# Patient Record
Sex: Female | Born: 1990 | Race: Black or African American | Hispanic: No | Marital: Married | Smoking: Former smoker
Health system: Southern US, Community
[De-identification: ages and names within clinical notes are randomized; demographics above are authoritative.]

## PROBLEM LIST (undated history)

## (undated) DIAGNOSIS — IMO0002 Reserved for concepts with insufficient information to code with codable children: Secondary | ICD-10-CM

## (undated) DIAGNOSIS — F424 Excoriation (skin-picking) disorder: Secondary | ICD-10-CM

## (undated) DIAGNOSIS — B009 Herpesviral infection, unspecified: Secondary | ICD-10-CM

## (undated) DIAGNOSIS — L981 Factitial dermatitis: Secondary | ICD-10-CM

## (undated) DIAGNOSIS — D649 Anemia, unspecified: Secondary | ICD-10-CM

## (undated) DIAGNOSIS — F329 Major depressive disorder, single episode, unspecified: Secondary | ICD-10-CM

## (undated) DIAGNOSIS — F32A Depression, unspecified: Secondary | ICD-10-CM

## (undated) DIAGNOSIS — B977 Papillomavirus as the cause of diseases classified elsewhere: Secondary | ICD-10-CM

## (undated) HISTORY — PX: NO PAST SURGERIES: SHX2092

---
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
## 2008-10-17 ENCOUNTER — Emergency Department: Payer: Self-pay | Admitting: Emergency Medicine

## 2008-11-10 ENCOUNTER — Emergency Department: Payer: Self-pay | Admitting: Emergency Medicine

## 2008-11-10 IMAGING — US US OB < 14 WEEKS
1 series · 17 of 28 positions shown · non-contrast
Comparison: none

REASON FOR EXAM: syncope, pregnant, abdominal pain
COMMENTS:

[Series 1: us ob < 14 weeks · 17 of 43 slices shown]
[im 1/43]
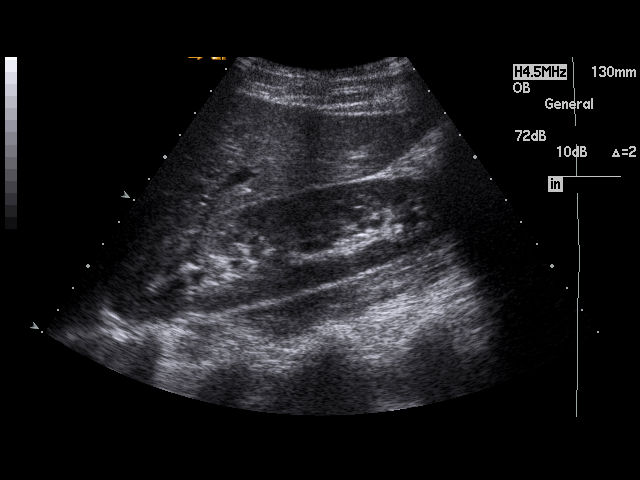
[im 4/43]
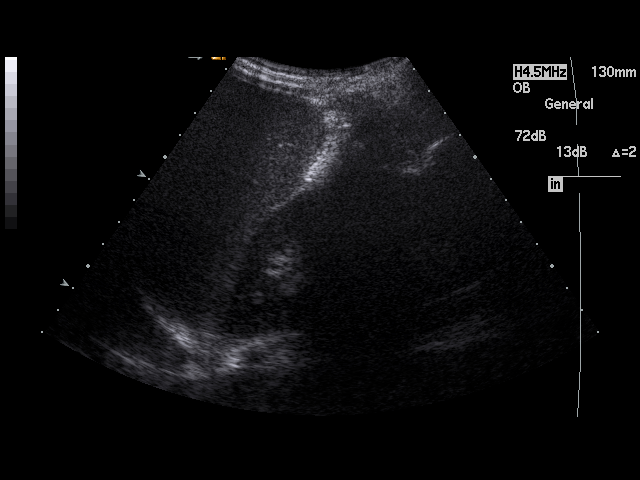
[im 7/43]
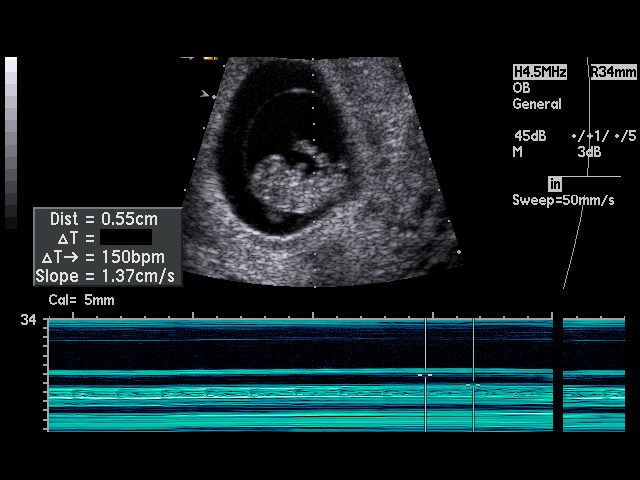
[im 8/43]
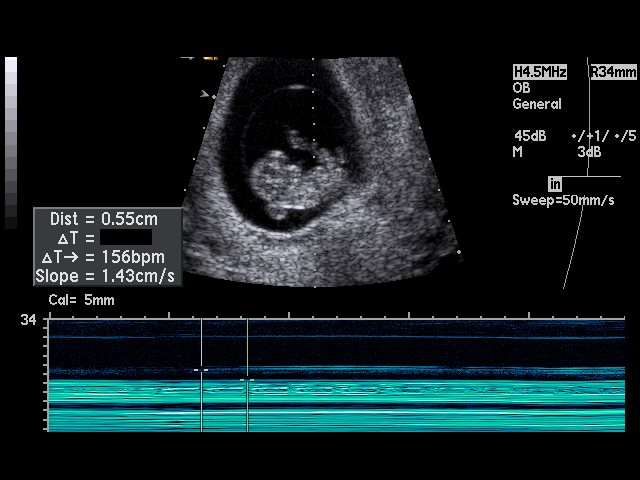
[im 11/43]
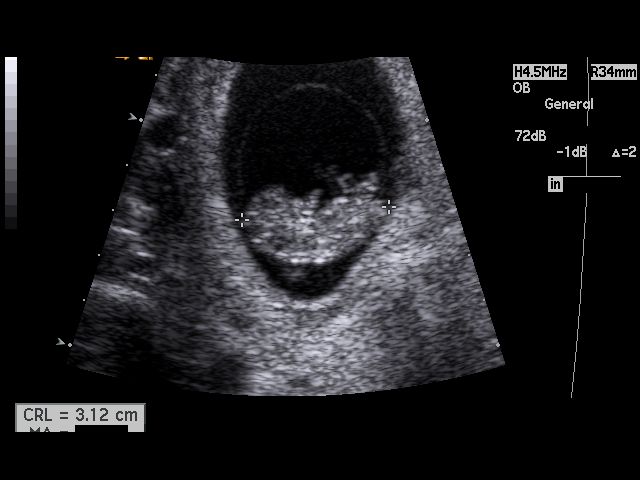
[im 15/43]
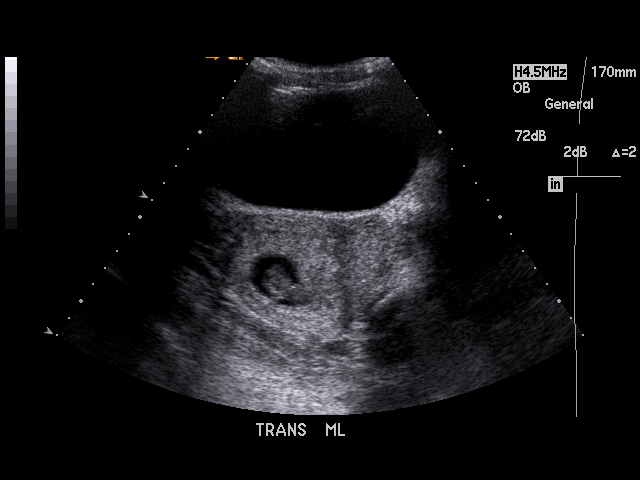
[im 16/43]
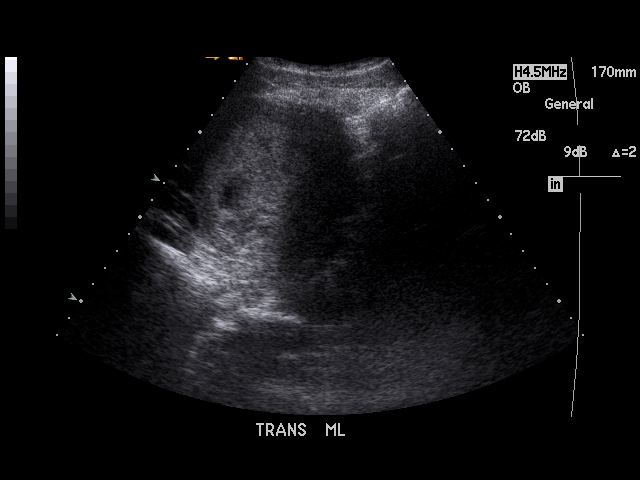
[im 19/43]
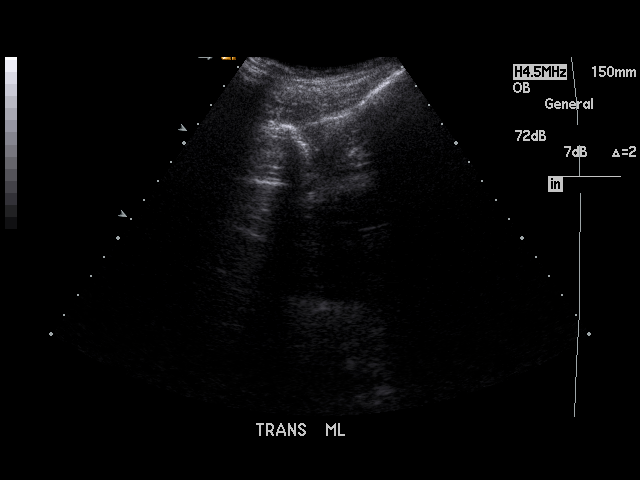
[im 22/43]
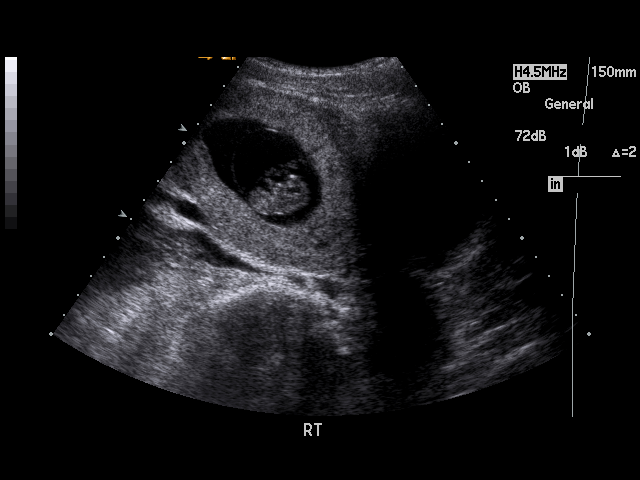
[im 24/43]
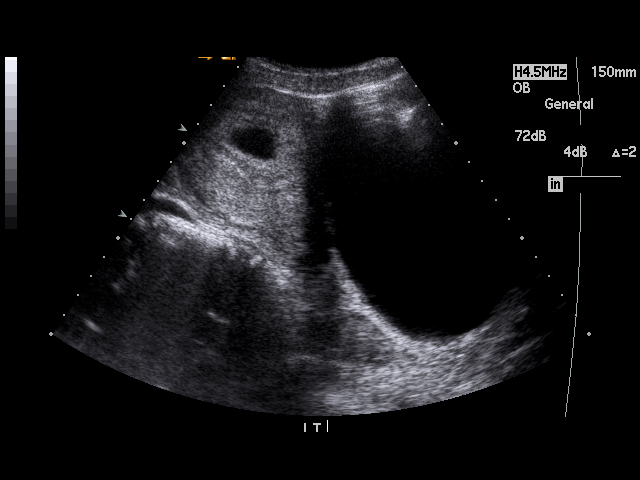
[im 27/43]
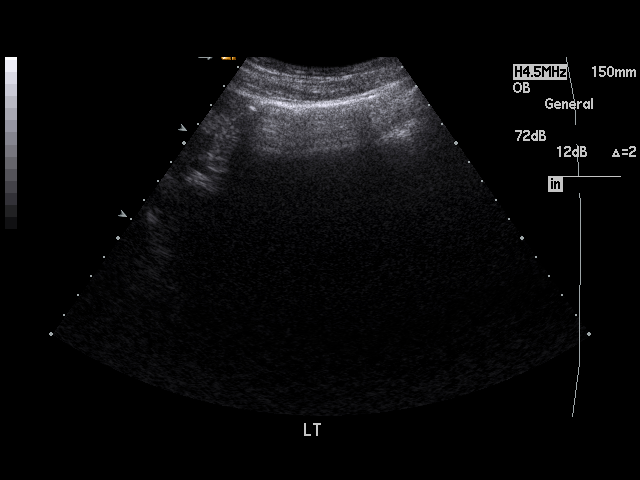
[im 29/43]
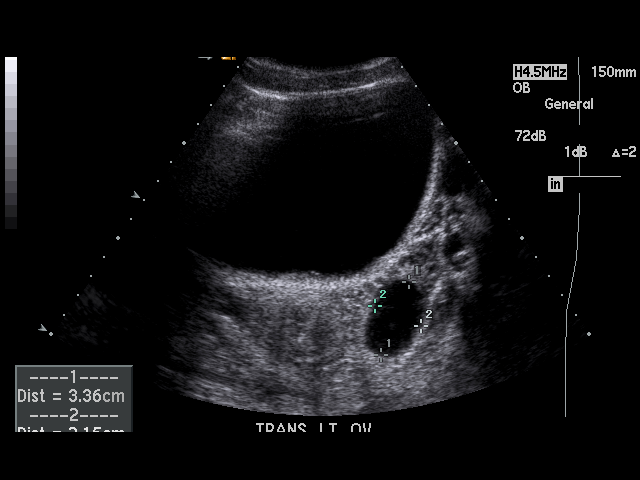
[im 32/43]
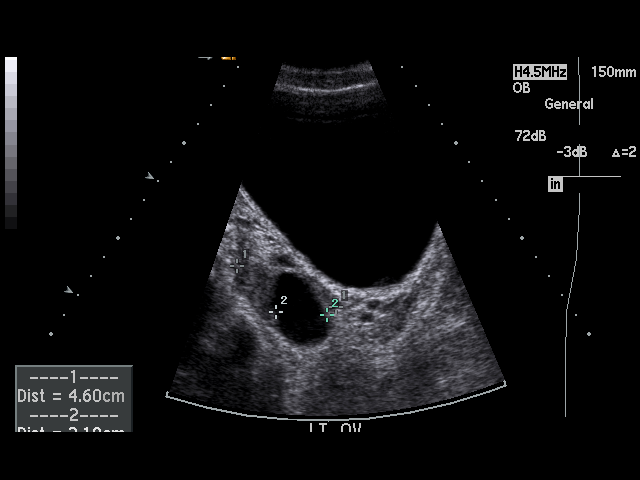
[im 35/43]
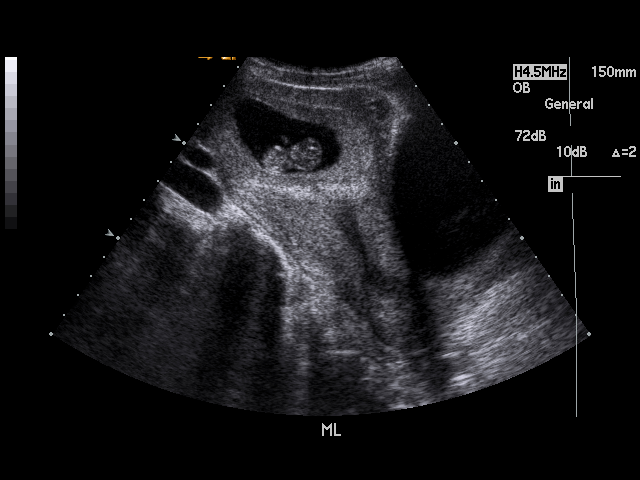
[im 36/43]
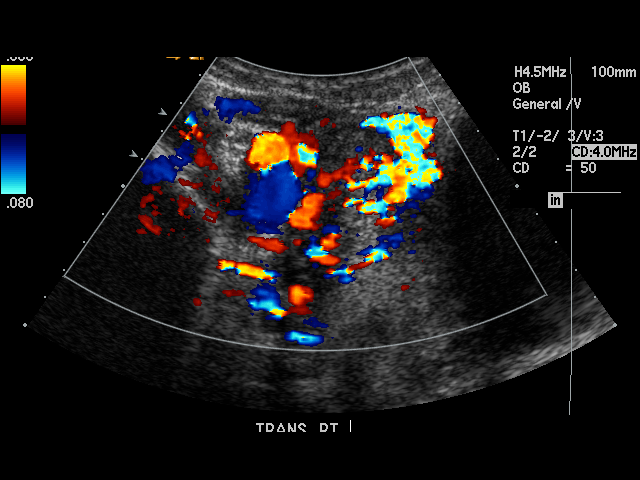
[im 39/43]
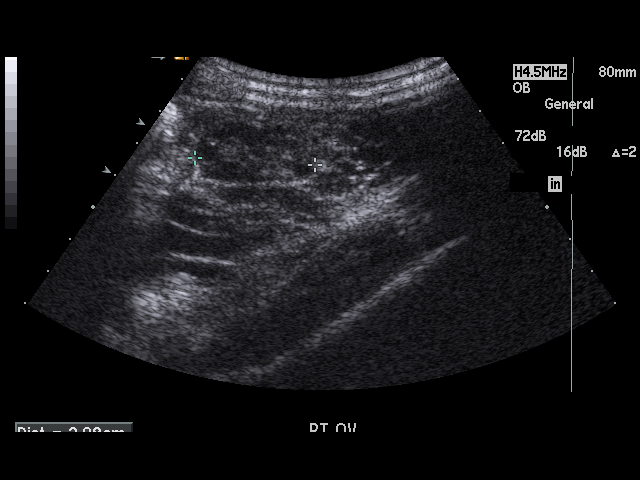
[im 43/43]
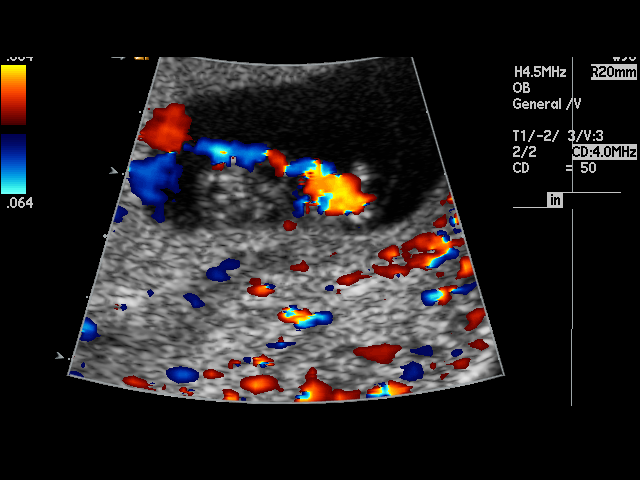

[17 of 28 positions shown; findings below may reference images not displayed]

PROCEDURE:     US  - US OB LESS THAN 14 WEEKS  - [DATE]  [DATE]

RESULT:     A single viable intrauterine pregnancy is appreciated with an
estimated gestational age of 10 [PO] days.  Estimated fetal heart rate is
150 beats per minute.  Evaluation of the RIGHT ovary demonstrates arterial
and venous flow.  A LEFT ovarian cyst is identified measuring 3.3 cm in
diameter. There is no evidence of pelvic free fluid nor drainable loculated
fluid collections.  The uterus demonstrates a homogenous echotexture. Crown
rump length is 3.13 cm which corresponds to an estimated gestational age of
10 [PO] days.
IMPRESSION: 1. Single, viable intrauterine pregnancy as described above.

Dr. ES of the Emergency Department was informed of these findings via
a preliminary faxed report on [DATE] at [DATE] p.m. Eastern Standard Time.

## 2008-12-27 ENCOUNTER — Emergency Department: Payer: Self-pay | Admitting: Emergency Medicine

## 2008-12-27 IMAGING — US US OB US >=[ID] SNGL FETUS
1 series · 17 of 28 positions shown · non-contrast
Comparison: none

REASON FOR EXAM: abd cramping, pregnant
COMMENTS:

[Series 1: us ob us >=(id) sngl fetus · 17 of 71 slices shown]
[im 1/71]
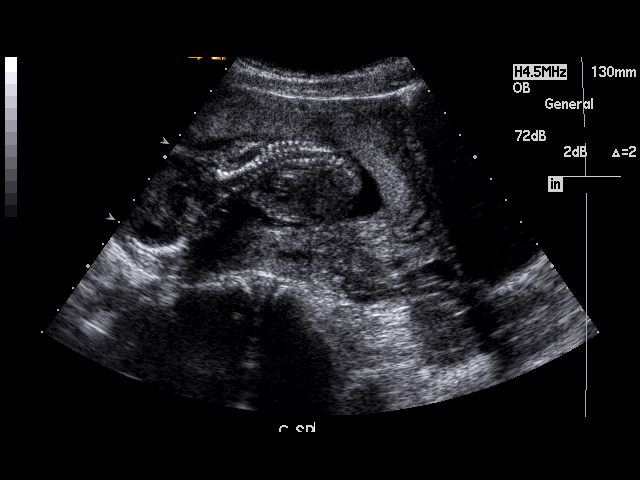
[im 6/71]
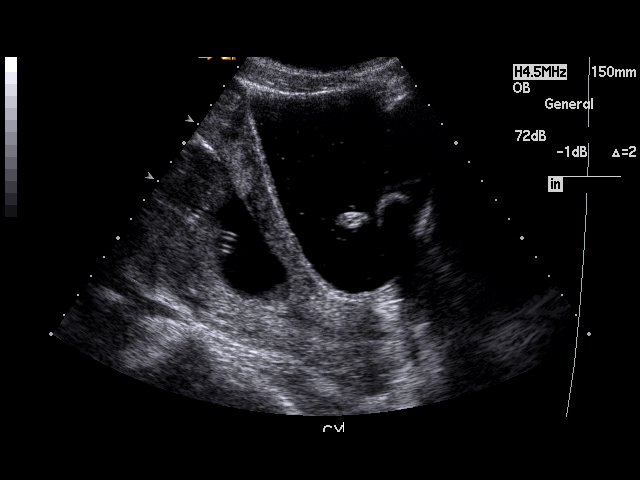
[im 11/71]
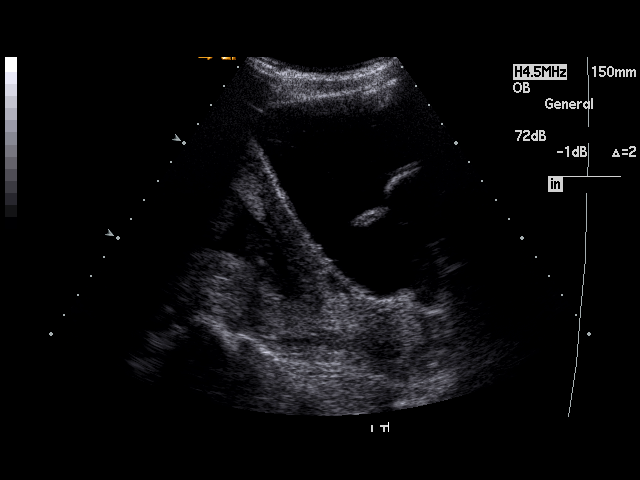
[im 13/71]
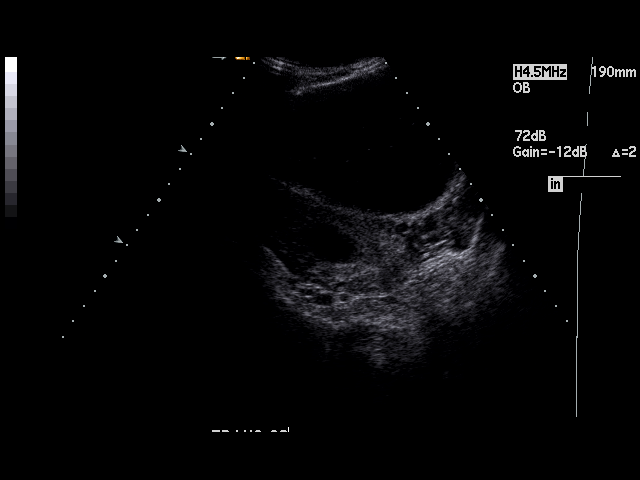
[im 19/71]
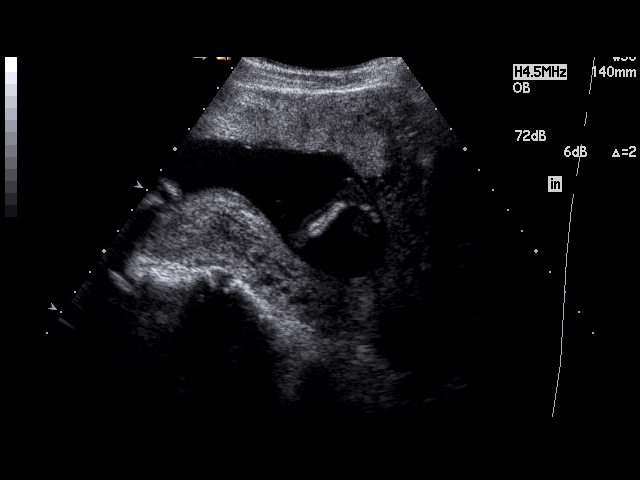
[im 24/71]
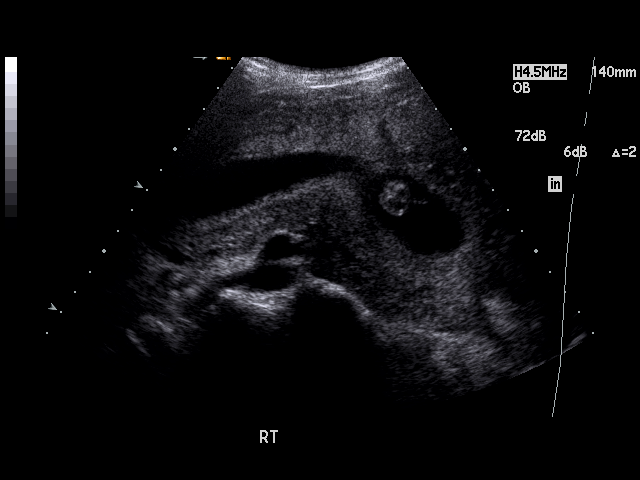
[im 26/71]
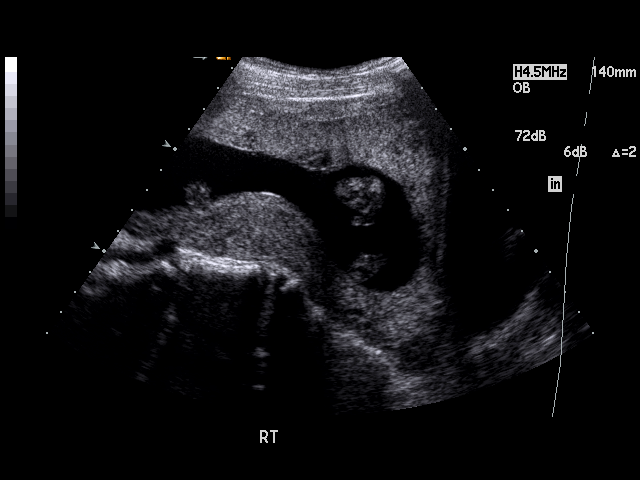
[im 32/71]
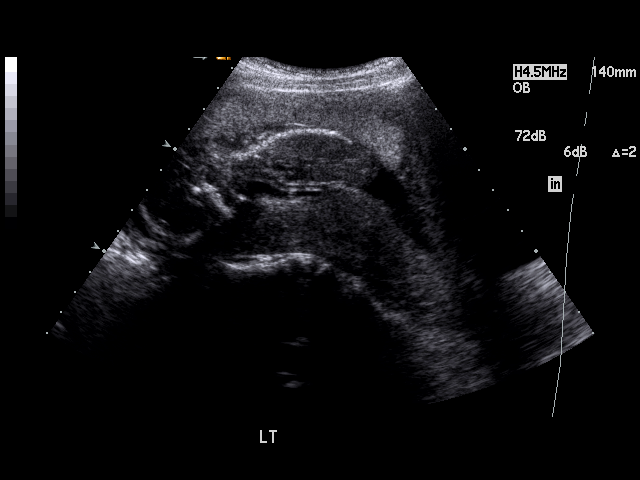
[im 37/71]
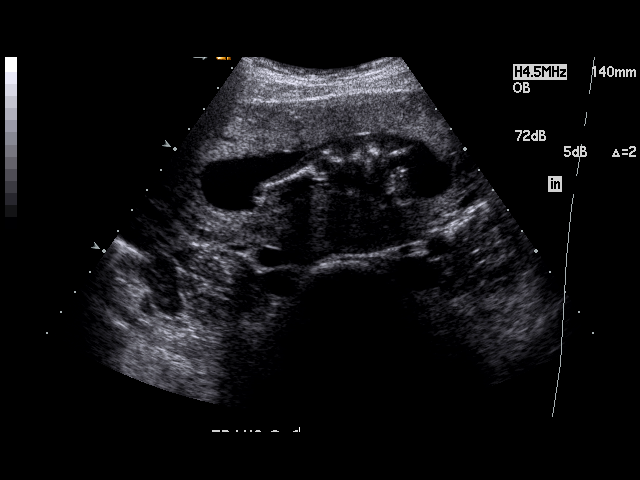
[im 39/71]
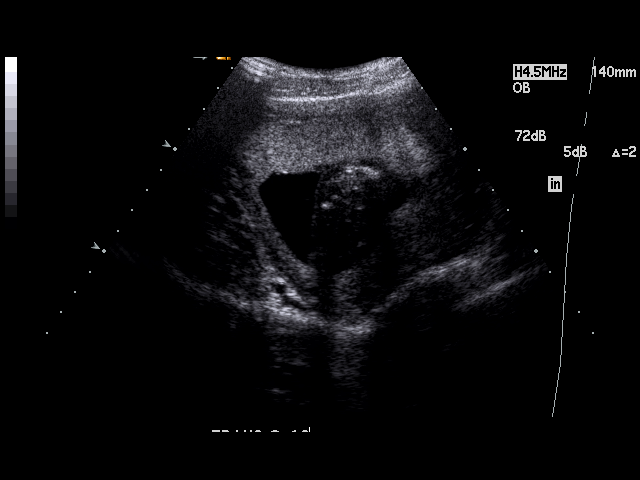
[im 45/71]
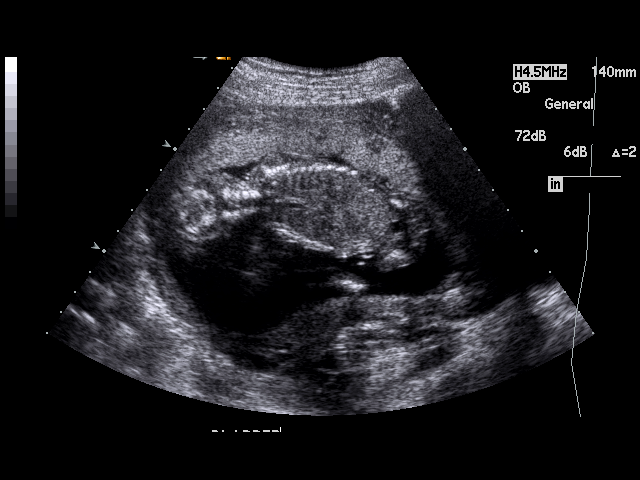
[im 47/71]
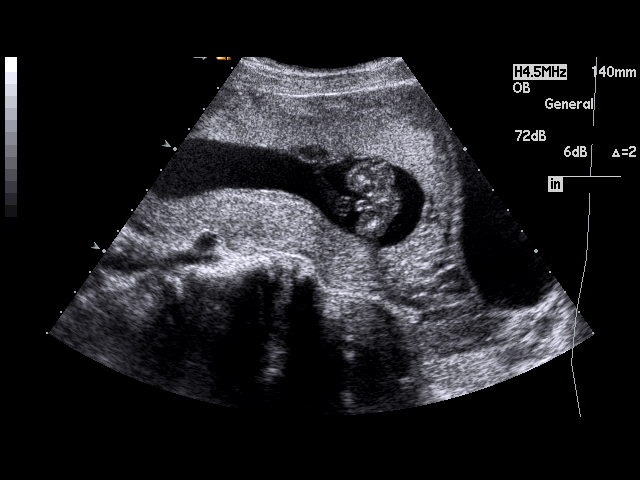
[im 52/71]
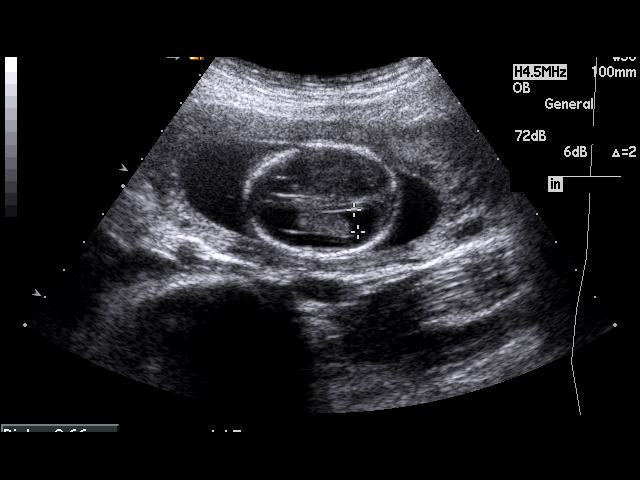
[im 58/71]
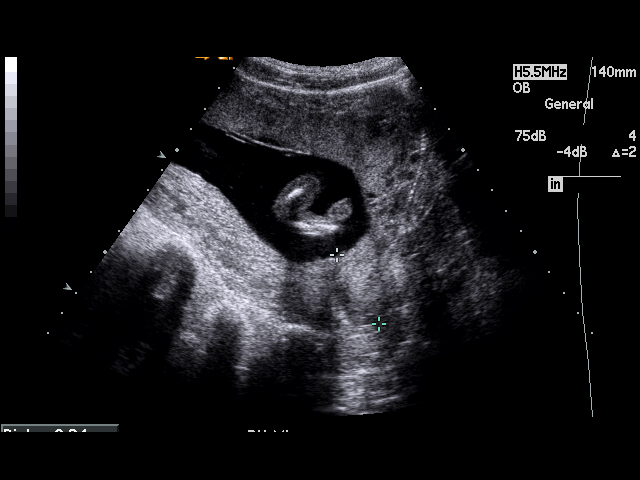
[im 60/71]
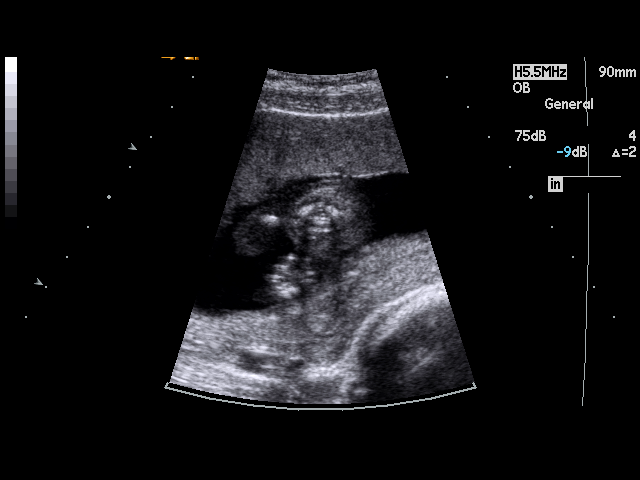
[im 65/71]
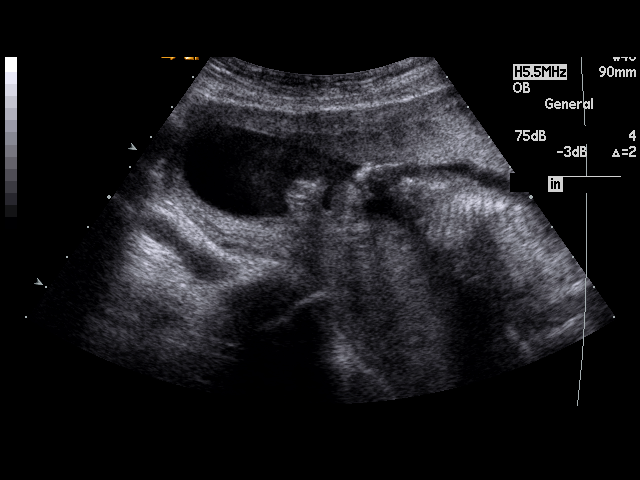
[im 71/71]
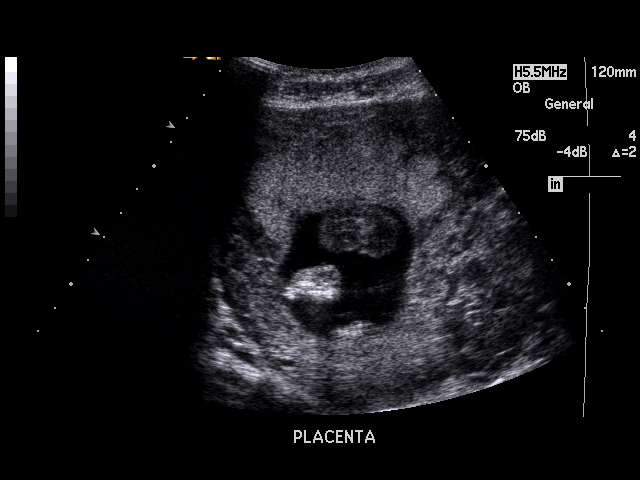

[17 of 28 positions shown; findings below may reference images not displayed]

PROCEDURE:     US  - US OB GREATER/OR EQUAL TO [D7]  - [DATE]  [DATE]

RESULT:     Emergent early OB protocol ultrasound examination is performed.
The fetus is too young to evaluate fetal anatomy and a followup scheduled
appointment would be recommended. There is a single intrauterine fetus with
cardiac, trunk and extremity motion visible during the exam. The amniotic
fluid volume is visually normal. The placenta is anterior in position. There
is no evidence of a marginal placenta or placenta previa. There is a breech
presentation. There has been appropriate growth since the previous early OB
ultrasound on [DATE]. The current gestational age is estimated at 16
weeks 4 days with an estimated delivery date of [DATE] and a current
estimated fetal weight of 181 grams + / - 24 grams.
IMPRESSION: No evidence of complication. The fetal anatomy cannot be
evaluated at this point given the early age. No gross abnormality is
evident. The fetal heart rate is 144 beats per minute. The

## 2009-02-18 ENCOUNTER — Ambulatory Visit: Payer: Self-pay | Admitting: Certified Nurse Midwife

## 2009-02-18 IMAGING — US US OB US >=[ID] SNGL FETUS
1 series · 17 of 28 positions shown · non-contrast
Comparison: none

REASON FOR EXAM: Anatomy Dates Placenta Loc
COMMENTS:

[Series 1: us ob us >=(id) sngl fetus · 17 of 45 slices shown]
[im 1/45]
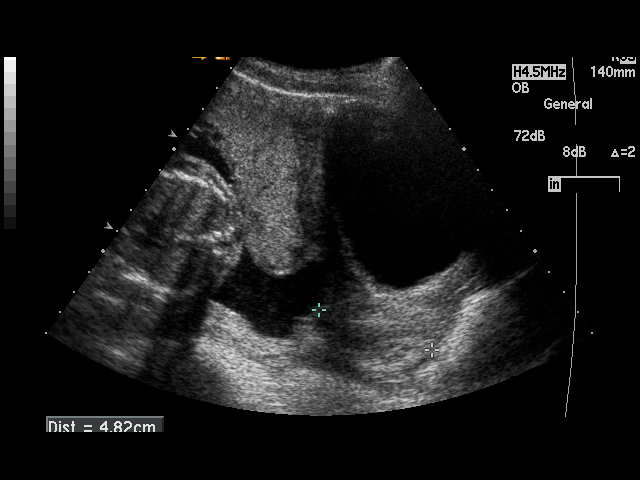
[im 4/45]
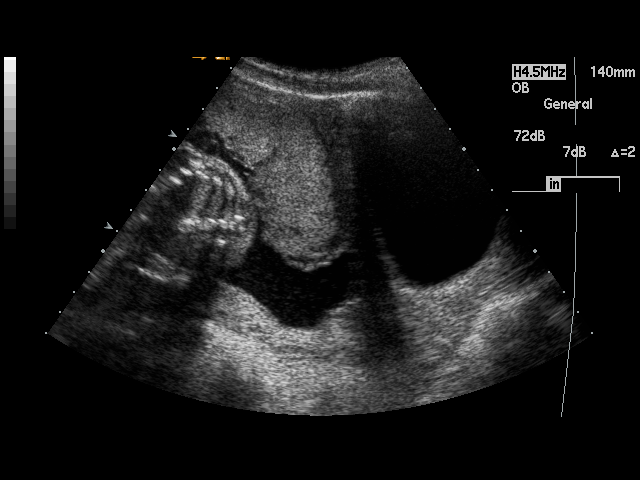
[im 7/45]
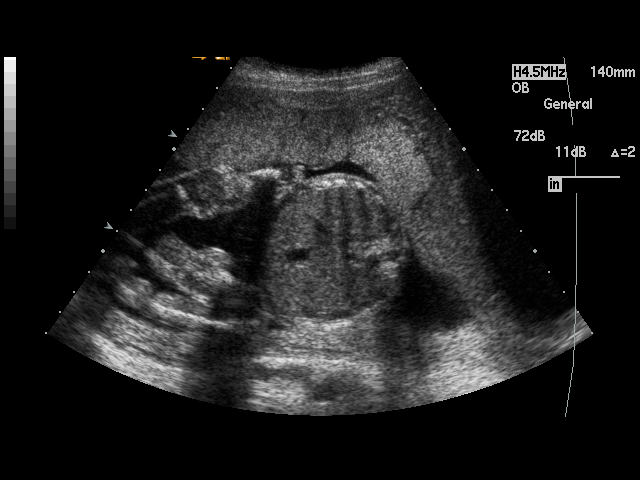
[im 9/45]
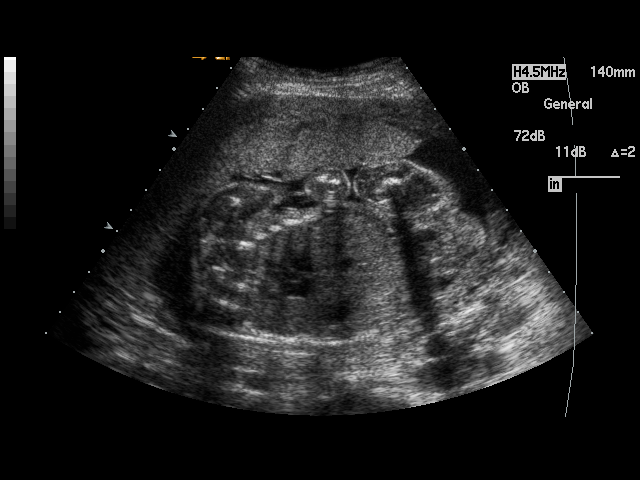
[im 12/45]
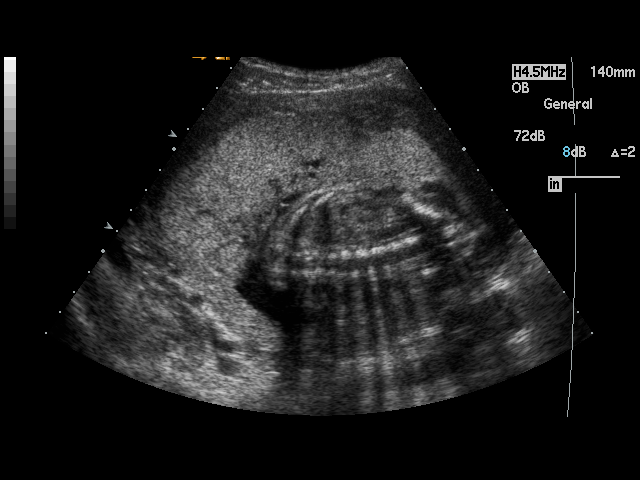
[im 15/45]
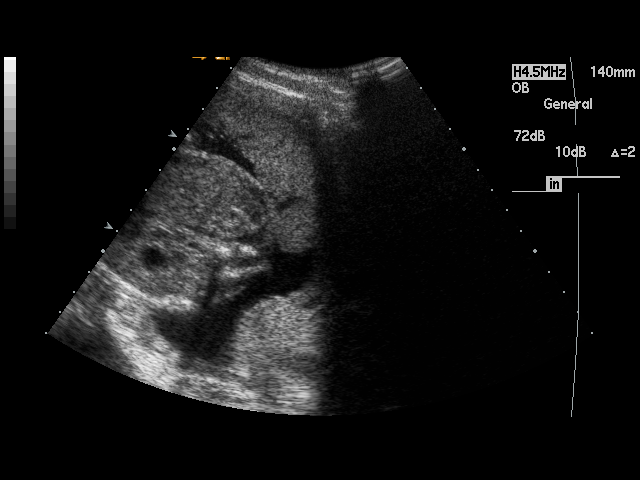
[im 17/45]
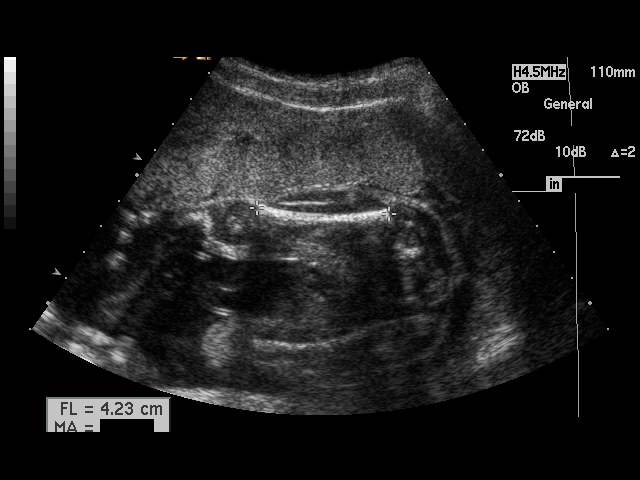
[im 20/45]
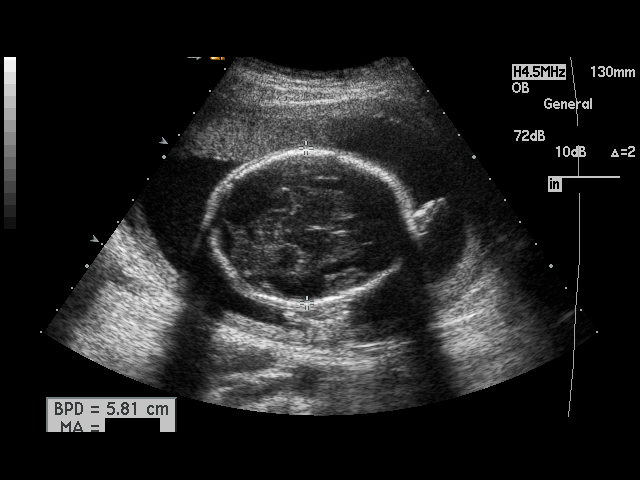
[im 23/45]
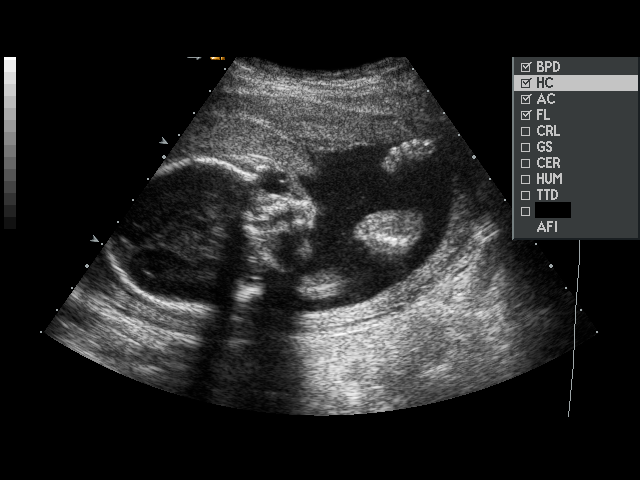
[im 25/45]
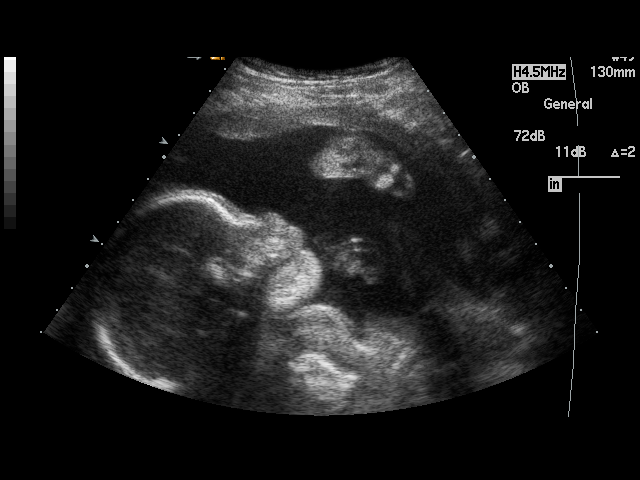
[im 28/45]
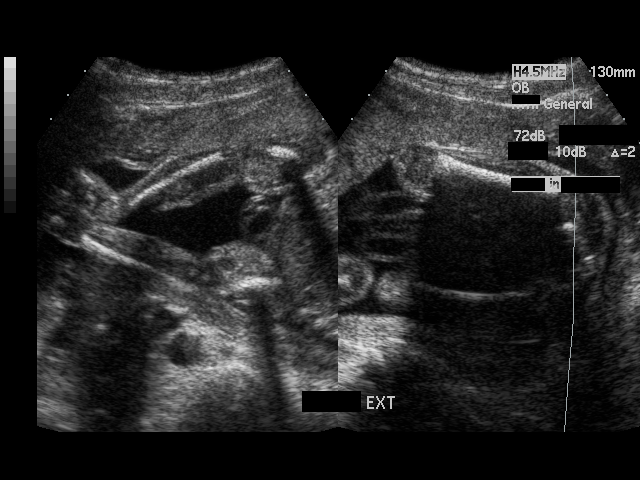
[im 30/45]
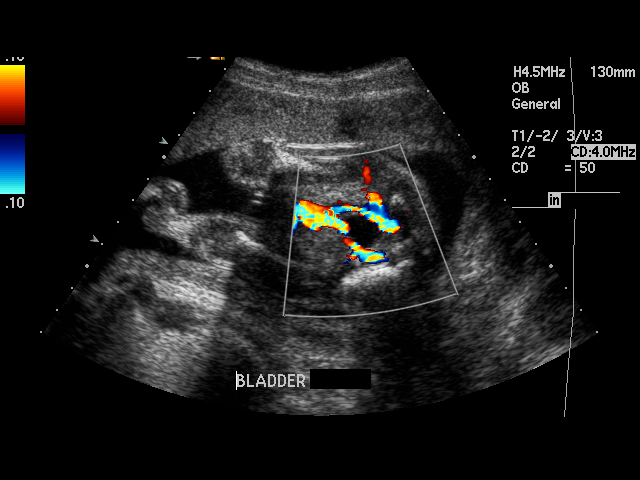
[im 33/45]
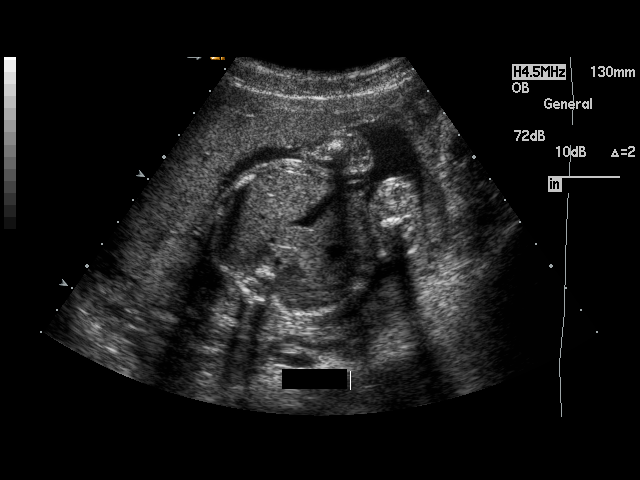
[im 36/45]
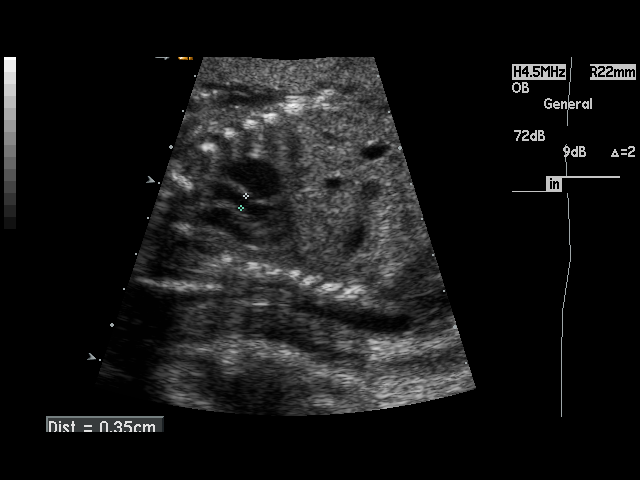
[im 38/45]
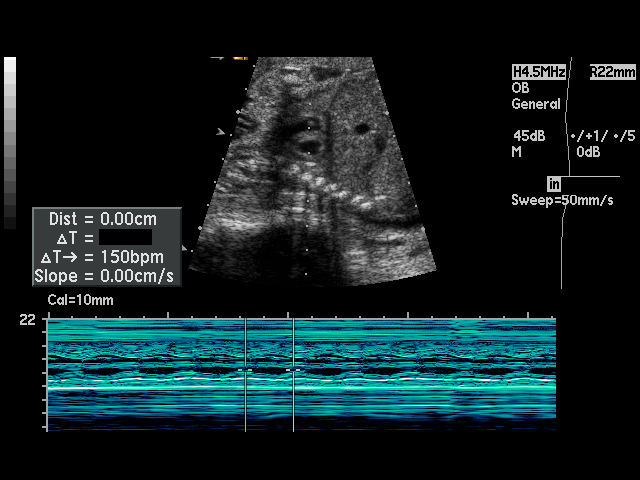
[im 41/45]
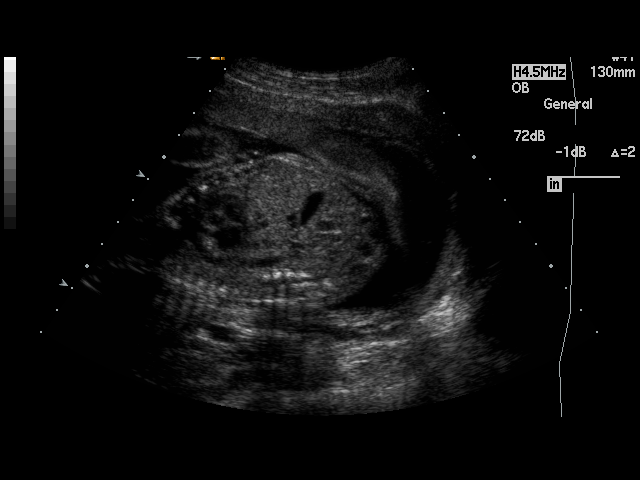
[im 45/45]
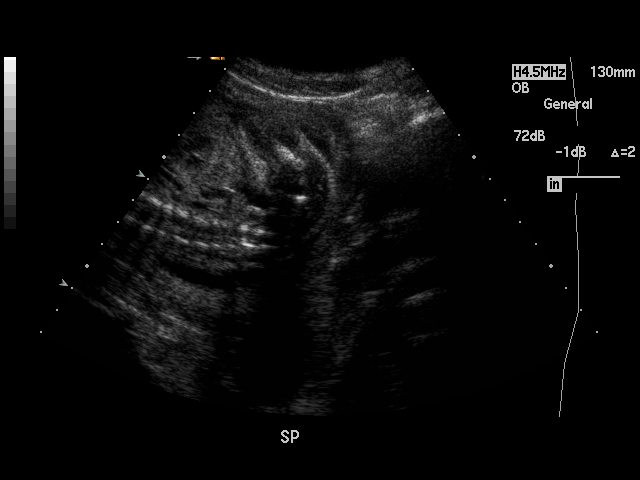

[17 of 28 positions shown; findings below may reference images not displayed]

PROCEDURE:     US  - US OB GREATER/OR EQUAL TO [Y8]  - [DATE]  [DATE]

RESULT:     There is a viable IUP with an anterior low lying placenta. The
presentation of the fetus is variable. The amniotic fluid volume is
estimated to be normal. Fetal cardiac activity with a rate of 150 beats
permit was demonstrated. The fetal stomach, kidneys, and urinary bladder
were demonstrated and appeared normal. The intracranial structures, the
craniocervical junction, and the spinal structures are normal in appearance.
A 4 chambered heart was demonstrated.

Measured parameters:

BPD 5.78 cm corresponding to an EGA of 23 weeks 5 days
HC 22.12 cm corresponding to an EGA of 24 weeks one day
A.C. 19.52 cm corresponding to an EGA of 24 weeks 2 days
FL 4.2 cm corresponding to an EGA of 5 correction of 23 weeks 5 days. The
estimated fetal weight is 669 grams + / - 90 grams.
IMPRESSION: 1. There is a viable IUP with variable presentation. The placenta is
anterior and low lying. The estimated gestational age is 24 weeks 0 days + / -
12 days. The estimated date of confinement is [DATE]. No fetal
anomalies were identified.

## 2009-04-02 ENCOUNTER — Ambulatory Visit: Payer: Self-pay | Admitting: Certified Nurse Midwife

## 2009-04-02 IMAGING — US US OB US >=[ID] SNGL FETUS
1 series · 17 of 28 positions shown · non-contrast
Comparison: none

REASON FOR EXAM: low lying placenta  28 weeks
COMMENTS:

[Series 1: us ob us >=(id) sngl fetus · 17 of 47 slices shown]
[im 1/47]
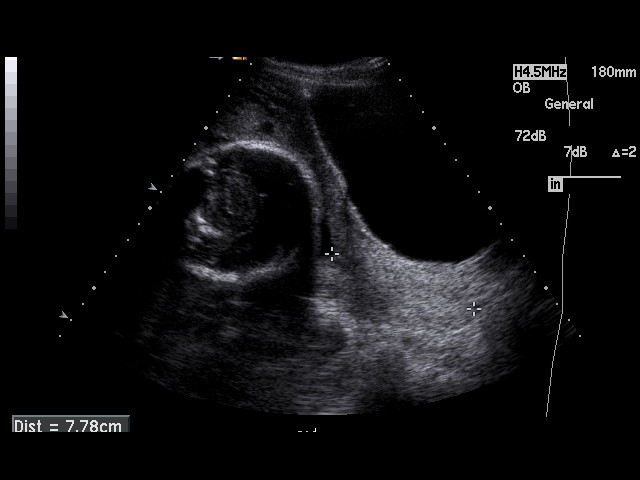
[im 4/47]
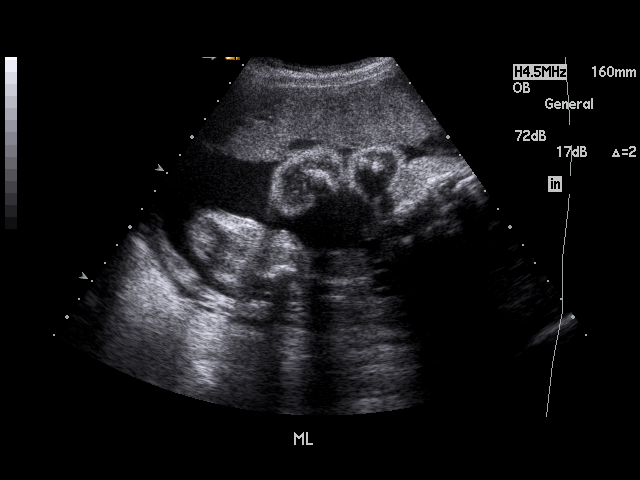
[im 7/47]
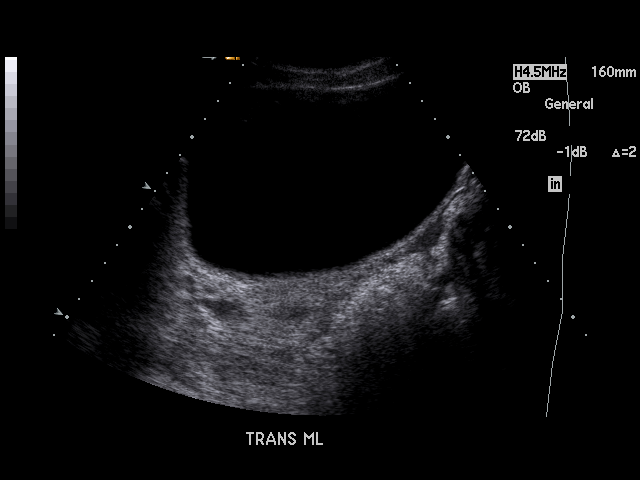
[im 9/47]
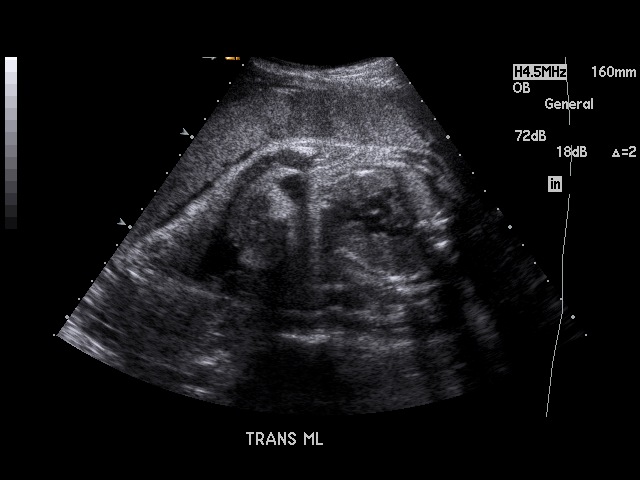
[im 12/47]
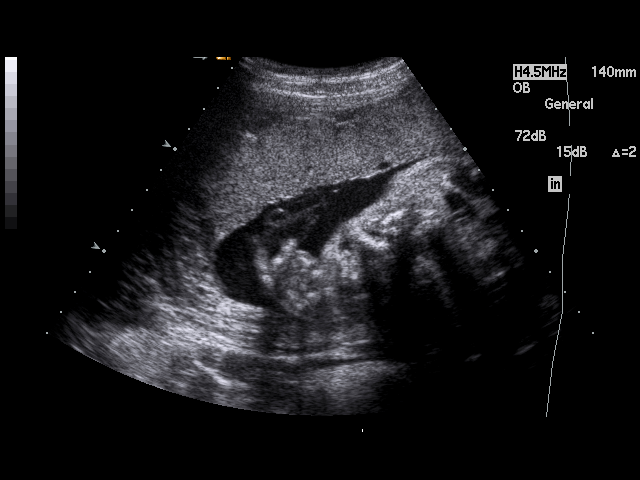
[im 16/47]
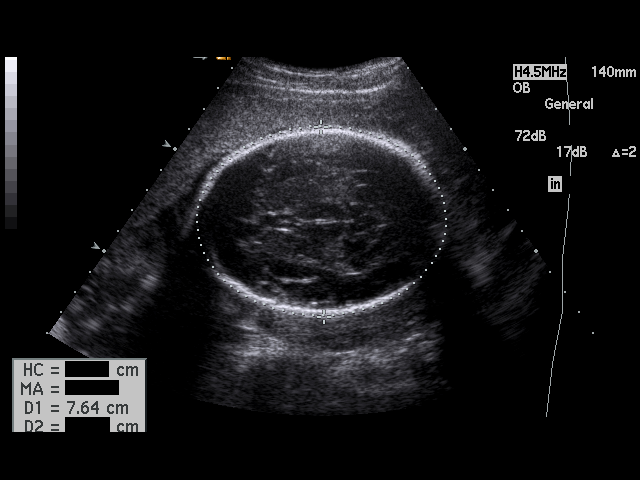
[im 18/47]
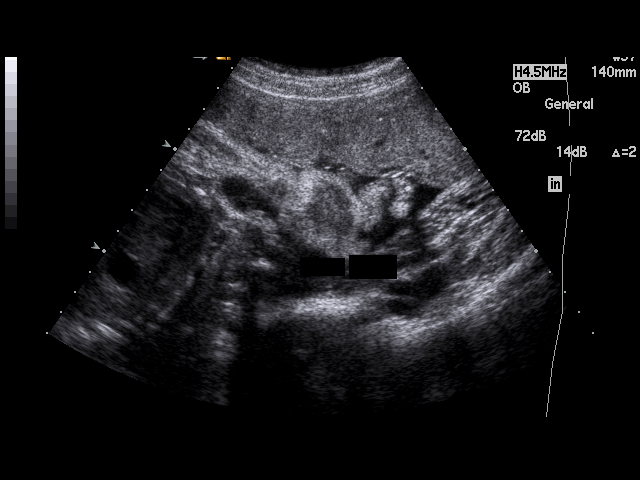
[im 21/47]
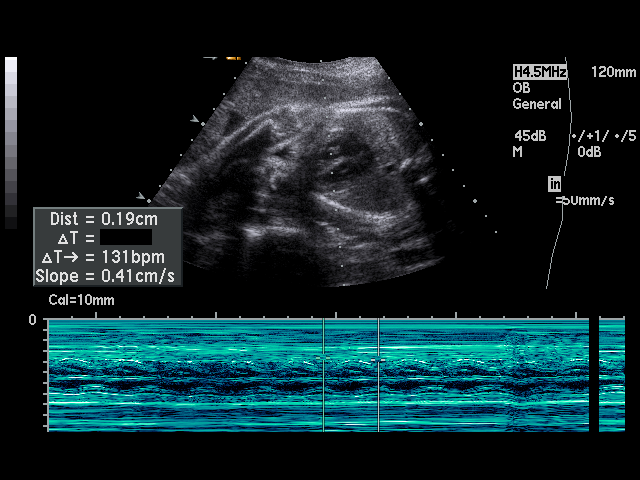
[im 24/47]
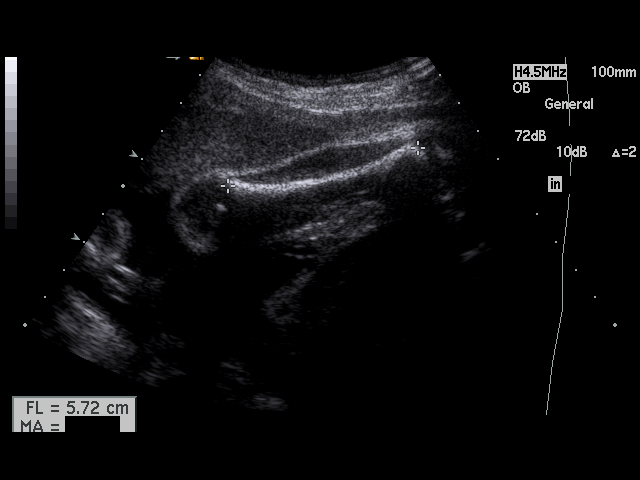
[im 26/47]
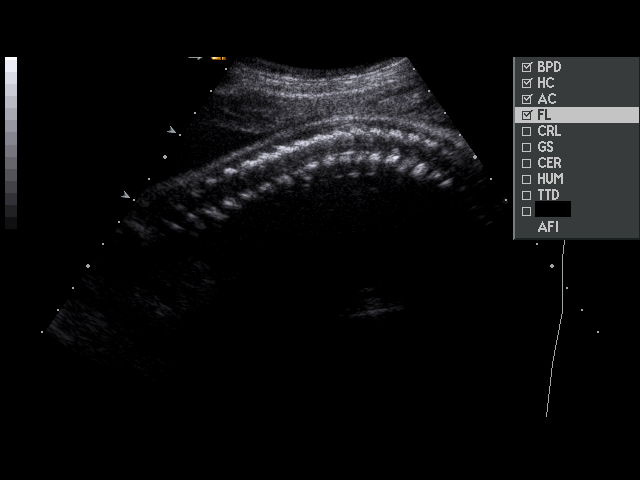
[im 29/47]
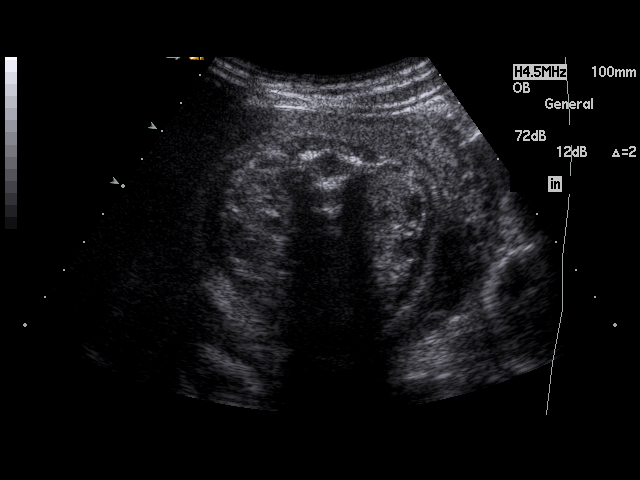
[im 31/47]
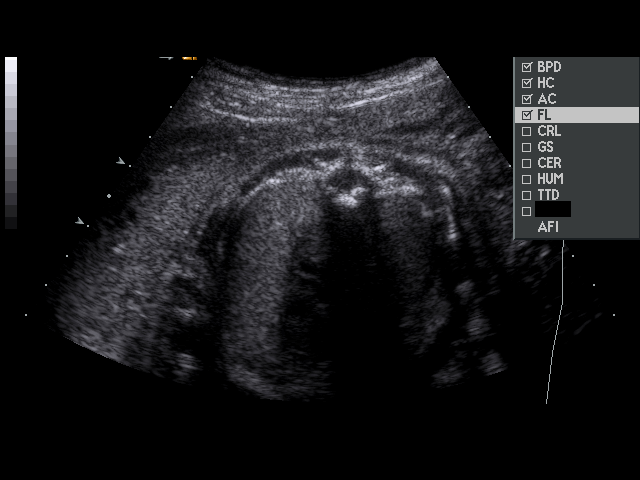
[im 35/47]
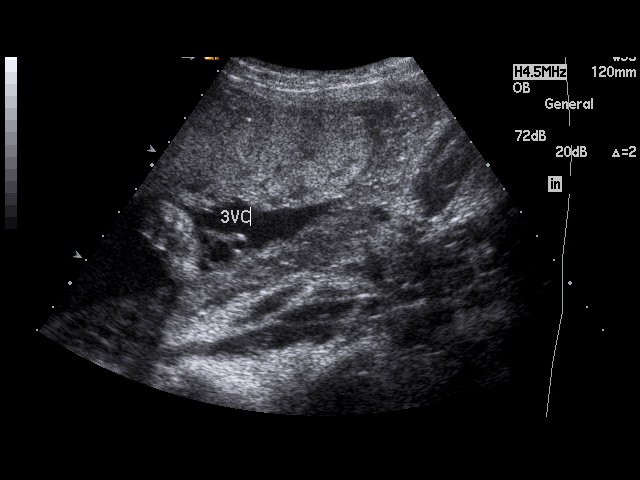
[im 38/47]
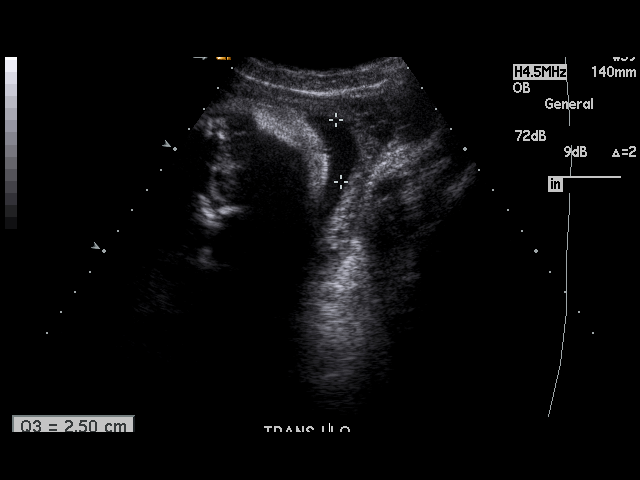
[im 40/47]
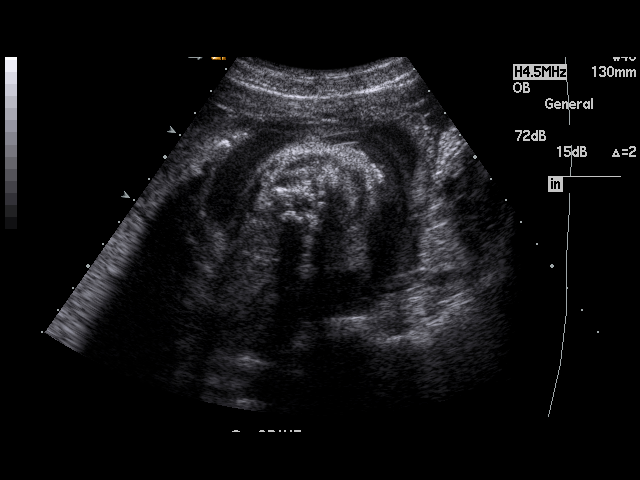
[im 43/47]
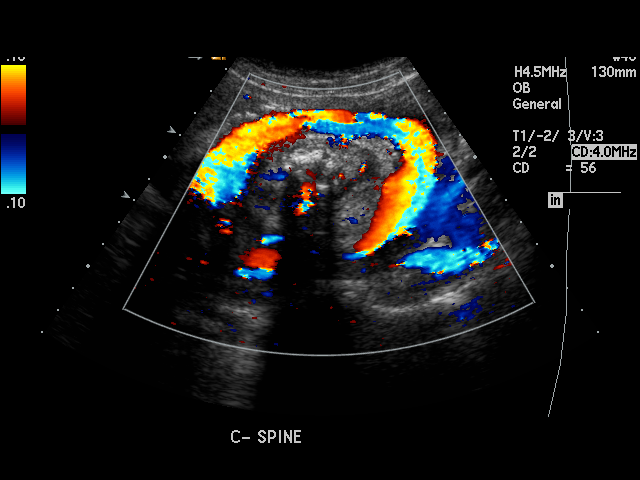
[im 47/47]
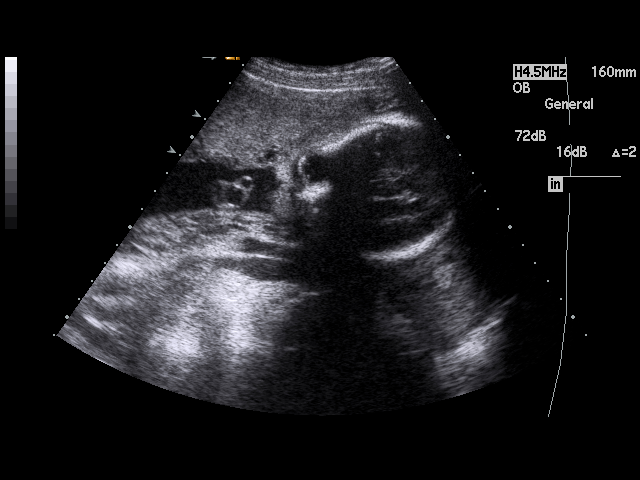

[17 of 28 positions shown; findings below may reference images not displayed]

PROCEDURE:     US  - US OB GREATER/OR EQUAL TO [4B]  - [DATE] [DATE]

RESULT:     There is a viable IUP present. The presentation is cephalic. The
placenta is no longer low lying. Is predominantly anterior and partially
fundal. The amniotic fluid volume ranges between the 5th and 50th percentile.

The intracranial structures, the craniocervical junction, and the spinal
structures are normal in appearance. A 4 chambered heart with a rate of 131
beats per minute was demonstrated. The fetal stomach, kidneys, and urinary
bladder were demonstrated and appeared normal. A three-vessel umbilical cord
was demonstrated. Portions of the umbilical cord appear to lie around the
cervical region.

Measured parameters:

BPD 7.24 cm corresponding to an EGA of 29 weeks 0 days
HC 27.92 cm corresponding to an EGA of 30 weeks 4 days
AC 25.49 cm corresponding to an EGA of 29 weeks 5 days
FL 5.72 cm corresponding to an EGA of 30 weeks 0 days.

 The estimated fetal weight is [4B] grams + / - 195 grams.
IMPRESSION: There is a viable IUP with cephalic presentation and
anterior partially fundal placenta. There is no evidence of placenta previa.
Incidental note is made of a nuchal umbilical cord. No fetal anomalies are
identified.

## 2009-05-27 ENCOUNTER — Observation Stay: Payer: Self-pay | Admitting: Obstetrics and Gynecology

## 2009-06-01 ENCOUNTER — Observation Stay: Payer: Self-pay

## 2009-06-05 ENCOUNTER — Inpatient Hospital Stay: Payer: Self-pay | Admitting: Obstetrics and Gynecology

## 2010-01-27 ENCOUNTER — Emergency Department: Payer: Self-pay | Admitting: Emergency Medicine

## 2010-12-01 ENCOUNTER — Emergency Department (HOSPITAL_COMMUNITY)
Admission: EM | Admit: 2010-12-01 | Discharge: 2010-12-01 | Disposition: A | Discharge status: Home / Self Care | Payer: Self-pay | Attending: Emergency Medicine | Admitting: Emergency Medicine

## 2010-12-01 DIAGNOSIS — A499 Bacterial infection, unspecified: Secondary | ICD-10-CM | POA: Insufficient documentation

## 2010-12-01 DIAGNOSIS — N899 Noninflammatory disorder of vagina, unspecified: Secondary | ICD-10-CM | POA: Insufficient documentation

## 2010-12-01 DIAGNOSIS — B9689 Other specified bacterial agents as the cause of diseases classified elsewhere: Secondary | ICD-10-CM | POA: Insufficient documentation

## 2010-12-01 DIAGNOSIS — N949 Unspecified condition associated with female genital organs and menstrual cycle: Secondary | ICD-10-CM | POA: Insufficient documentation

## 2010-12-01 DIAGNOSIS — N760 Acute vaginitis: Secondary | ICD-10-CM | POA: Insufficient documentation

## 2010-12-01 DIAGNOSIS — N72 Inflammatory disease of cervix uteri: Secondary | ICD-10-CM | POA: Insufficient documentation

## 2010-12-01 DIAGNOSIS — A6000 Herpesviral infection of urogenital system, unspecified: Secondary | ICD-10-CM | POA: Insufficient documentation

## 2010-12-01 LAB — URINALYSIS, ROUTINE W REFLEX MICROSCOPIC
Glucose, UA: NEGATIVE mg/dL
Protein, ur: NEGATIVE mg/dL
pH: 6 (ref 5.0–8.0)

## 2010-12-01 LAB — WET PREP, GENITAL: Trich, Wet Prep: NONE SEEN

## 2010-12-01 LAB — URINE MICROSCOPIC-ADD ON

## 2011-04-19 ENCOUNTER — Emergency Department: Payer: Self-pay | Admitting: Emergency Medicine

## 2011-04-19 IMAGING — CR SACRUM AND COCCYX - 2+ VIEW
1 series · 3 of 3 positions shown · non-contrast
Comparison: none

REASON FOR EXAM: painful after fall
COMMENTS:   May transport without cardiac monitor

PROCEDURE:     DXR - DXR SACRUM AND COCCYX  - [DATE]  [DATE]
RESULT:     Findings: 3 views of the sacrum are provided. There is no acute
fracture. The SI joints are normal.
There is a T type IUD noted.

[Series 1: view not recorded · 0.17mm/px · 3 of 3 slices shown]
[im 1/3]
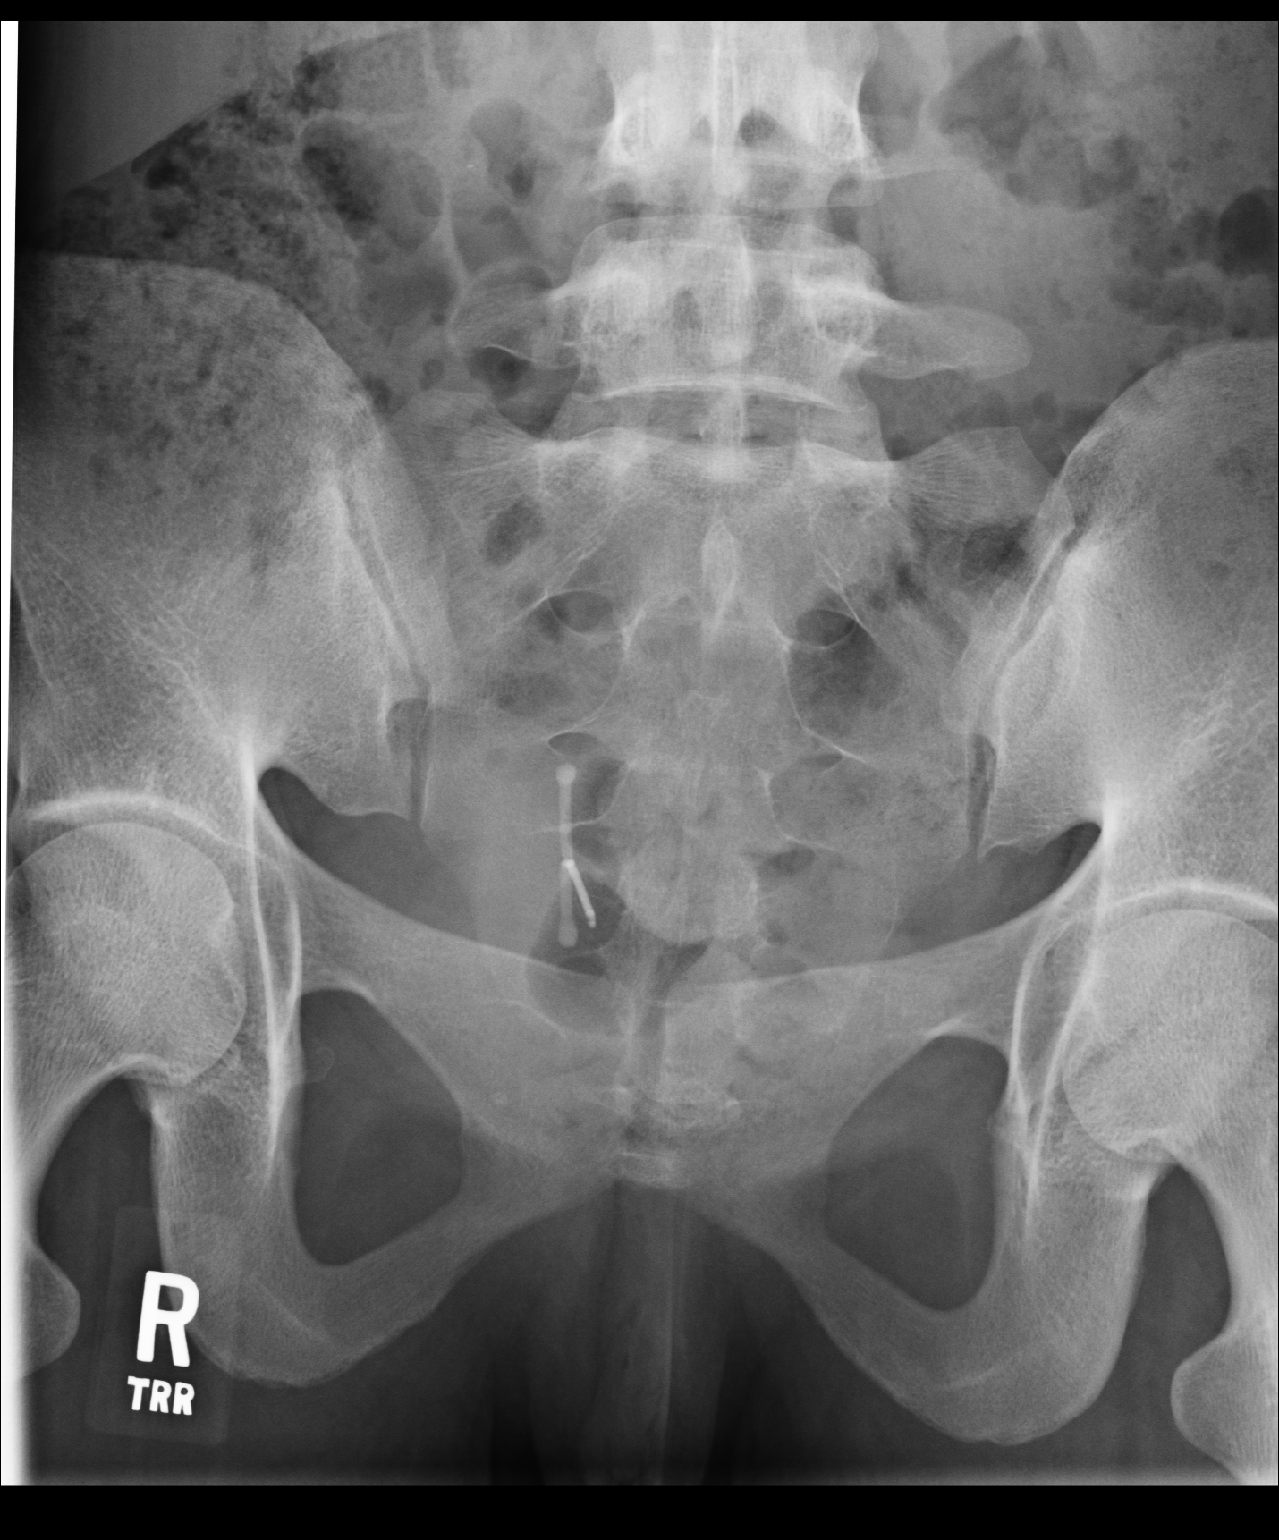
[im 2/3]
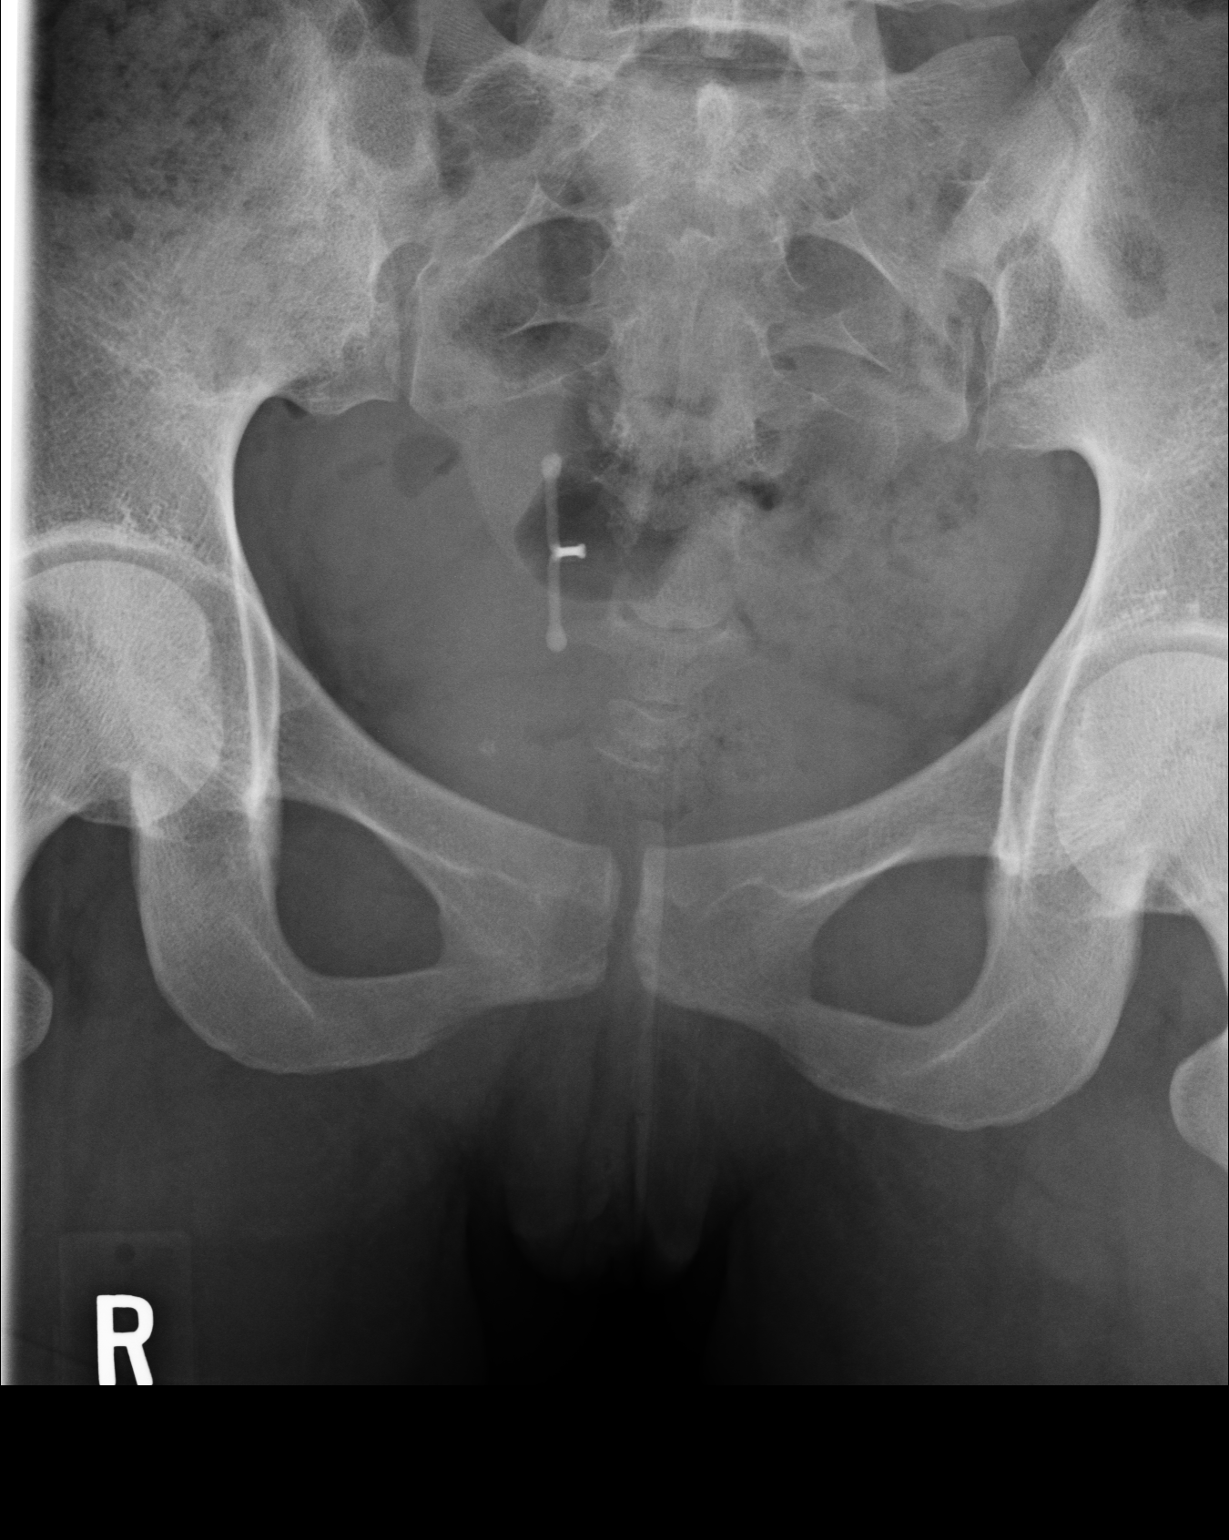
[im 3/3]
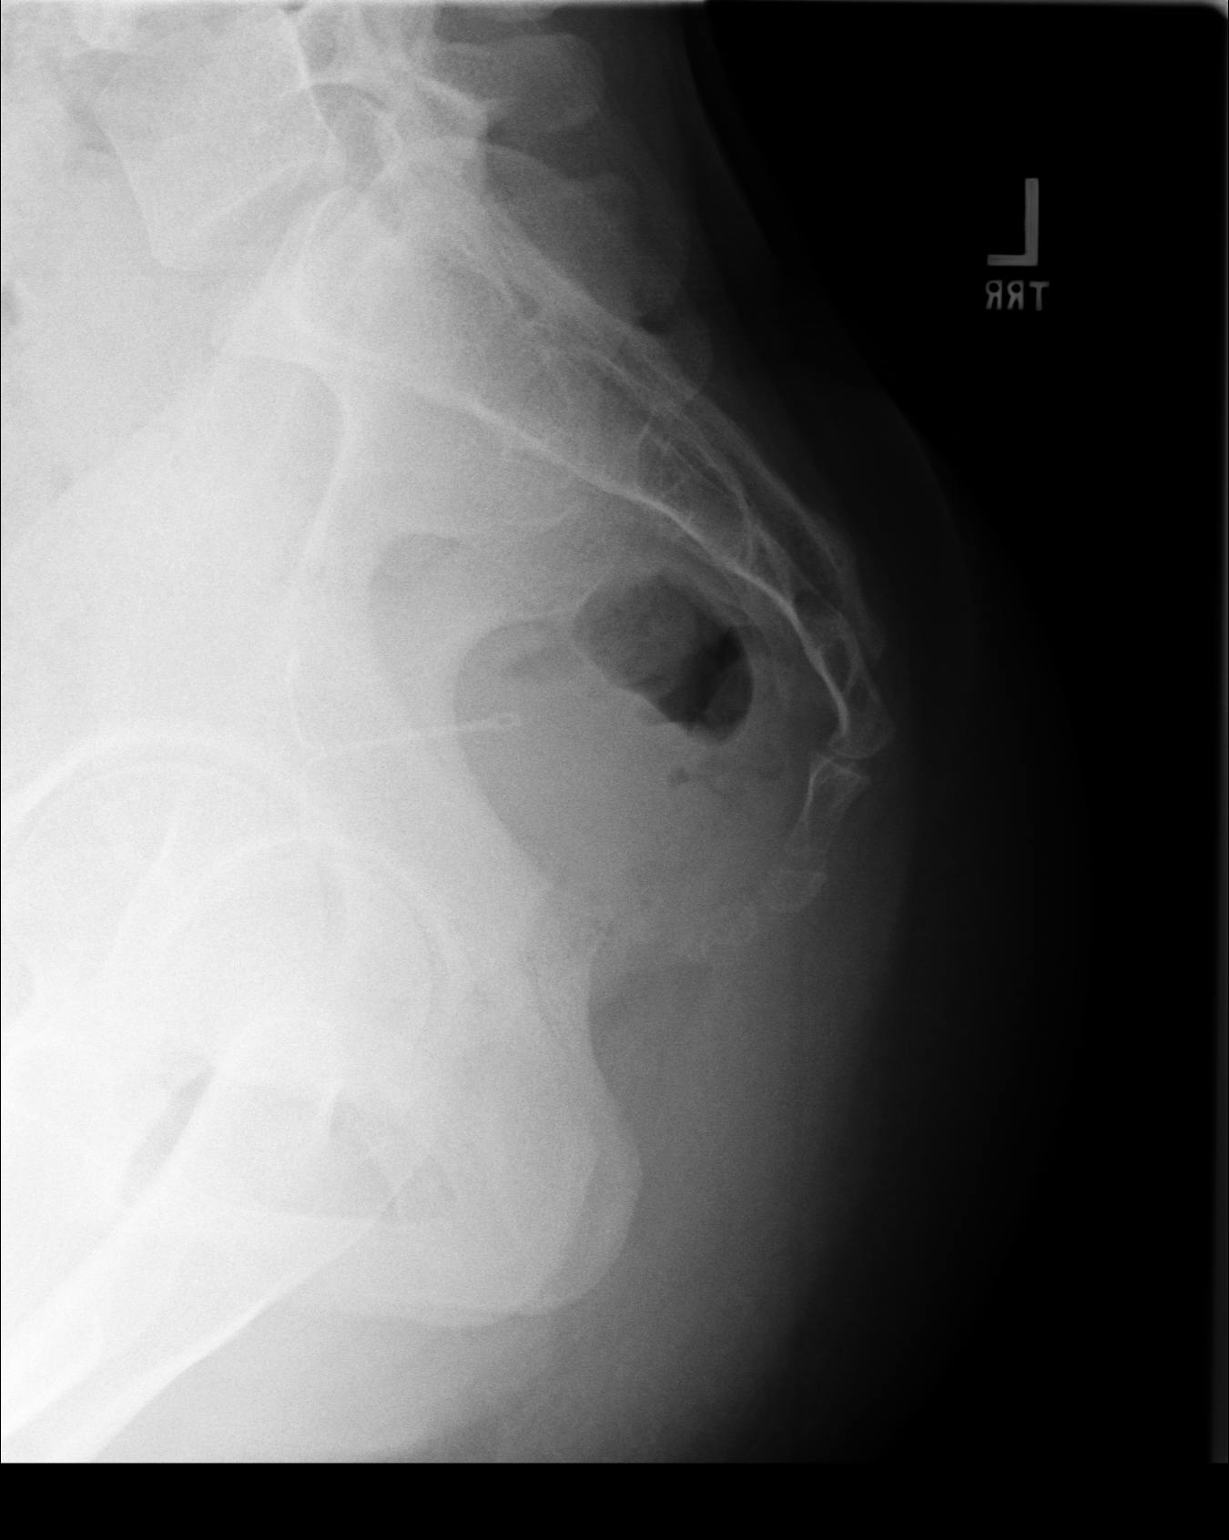

[3 of 3 positions shown; findings below may reference images not displayed]

IMPRESSION: No acute osseous injury of the sacrum or coccyx.

## 2012-03-18 ENCOUNTER — Inpatient Hospital Stay: Payer: Self-pay | Admitting: Obstetrics and Gynecology

## 2012-03-18 LAB — CBC WITH DIFFERENTIAL/PLATELET
Basophil #: 0.2 10*3/uL — ABNORMAL HIGH (ref 0.0–0.1)
Eosinophil %: 4.6 %
HGB: 11.1 g/dL — ABNORMAL LOW (ref 12.0–16.0)
Lymphocyte #: 2.3 10*3/uL (ref 1.0–3.6)
Lymphocyte %: 23 %
MCH: 29.1 pg (ref 26.0–34.0)
MCHC: 34.4 g/dL (ref 32.0–36.0)
Monocyte %: 9.9 %
Neutrophil %: 60.3 %
Platelet: 296 10*3/uL (ref 150–440)
RDW: 13.6 % (ref 11.5–14.5)

## 2012-03-19 LAB — CBC WITH DIFFERENTIAL/PLATELET
Basophil #: 0 10*3/uL (ref 0.0–0.1)
Basophil %: 0.3 %
Eosinophil #: 0.3 10*3/uL (ref 0.0–0.7)
Eosinophil %: 3.8 %
HCT: 29.6 % — ABNORMAL LOW (ref 35.0–47.0)
HGB: 9.9 g/dL — ABNORMAL LOW (ref 12.0–16.0)
Lymphocyte #: 1.4 10*3/uL (ref 1.0–3.6)
Lymphocyte %: 16.6 %
MCHC: 33.6 g/dL (ref 32.0–36.0)
Monocyte #: 0.5 x10 3/mm (ref 0.2–0.9)
Neutrophil #: 6.1 10*3/uL (ref 1.4–6.5)
RBC: 3.49 10*6/uL — ABNORMAL LOW (ref 3.80–5.20)

## 2012-03-19 LAB — HEMATOCRIT: HCT: 27.1 % — ABNORMAL LOW (ref 35.0–47.0)

## 2012-10-29 ENCOUNTER — Emergency Department: Payer: Self-pay | Admitting: Emergency Medicine

## 2012-10-29 LAB — CBC
HCT: 37.5 % (ref 35.0–47.0)
MCHC: 32.7 g/dL (ref 32.0–36.0)
MCV: 86 fL (ref 80–100)
Platelet: 312 10*3/uL (ref 150–440)
WBC: 14.3 10*3/uL — ABNORMAL HIGH (ref 3.6–11.0)

## 2012-10-29 LAB — URINALYSIS, COMPLETE
Bilirubin,UR: NEGATIVE
Protein: 100
RBC,UR: 25 /HPF (ref 0–5)

## 2012-10-29 LAB — COMPREHENSIVE METABOLIC PANEL
Albumin: 3.5 g/dL (ref 3.4–5.0)
Alkaline Phosphatase: 82 U/L (ref 50–136)
Anion Gap: 6 — ABNORMAL LOW (ref 7–16)
Calcium, Total: 8.3 mg/dL — ABNORMAL LOW (ref 8.5–10.1)
Creatinine: 0.92 mg/dL (ref 0.60–1.30)
EGFR (African American): >60
EGFR (Non-African Amer.): >60
Osmolality: 276 (ref 275–301)
Potassium: 3.4 mmol/L — ABNORMAL LOW (ref 3.5–5.1)
SGPT (ALT): 17 U/L (ref 12–78)

## 2012-10-29 LAB — PREGNANCY, URINE: Pregnancy Test, Urine: NEGATIVE m[IU]/mL

## 2012-10-31 LAB — URINE CULTURE

## 2012-11-04 LAB — CULTURE, BLOOD (SINGLE)

## 2014-06-26 ENCOUNTER — Emergency Department: Payer: Self-pay | Admitting: Emergency Medicine

## 2014-06-26 LAB — COMPREHENSIVE METABOLIC PANEL
ALBUMIN: 3.6 g/dL (ref 3.4–5.0)
ANION GAP: 5 — AB (ref 7–16)
Alkaline Phosphatase: 112 U/L
BUN: 8 mg/dL (ref 7–18)
Bilirubin,Total: 0.4 mg/dL (ref 0.2–1.0)
CALCIUM: 8.9 mg/dL (ref 8.5–10.1)
CHLORIDE: 106 mmol/L (ref 98–107)
CREATININE: 0.7 mg/dL (ref 0.60–1.30)
Co2: 29 mmol/L (ref 21–32)
EGFR (African American): >60
Glucose: 73 mg/dL (ref 65–99)
OSMOLALITY: 276 (ref 275–301)
Potassium: 3.8 mmol/L (ref 3.5–5.1)
SGOT(AST): 48 U/L — ABNORMAL HIGH (ref 15–37)
SGPT (ALT): 147 U/L — ABNORMAL HIGH
Sodium: 140 mmol/L (ref 136–145)
Total Protein: 7.8 g/dL (ref 6.4–8.2)

## 2014-06-26 LAB — URINALYSIS, COMPLETE
Bilirubin,UR: NEGATIVE
Glucose,UR: NEGATIVE mg/dL (ref 0–75)
Ketone: NEGATIVE
Leukocyte Esterase: NEGATIVE
Nitrite: NEGATIVE
PH: 5 (ref 4.5–8.0)
Protein: NEGATIVE
RBC,UR: 3 /HPF (ref 0–5)
Specific Gravity: 1.024 (ref 1.003–1.030)
WBC UR: 1 /HPF (ref 0–5)

## 2014-06-26 LAB — CBC WITH DIFFERENTIAL/PLATELET
Basophil #: 0 10*3/uL (ref 0.0–0.1)
Basophil %: 0.6 %
EOS PCT: 1.9 %
Eosinophil #: 0.1 10*3/uL (ref 0.0–0.7)
HCT: 38.9 % (ref 35.0–47.0)
HGB: 12.5 g/dL (ref 12.0–16.0)
LYMPHS ABS: 1.6 10*3/uL (ref 1.0–3.6)
LYMPHS PCT: 25.5 %
MCH: 28.4 pg (ref 26.0–34.0)
MCHC: 32.1 g/dL (ref 32.0–36.0)
MCV: 89 fL (ref 80–100)
MONO ABS: 0.8 x10 3/mm (ref 0.2–0.9)
MONOS PCT: 12.8 %
Neutrophil #: 3.8 10*3/uL (ref 1.4–6.5)
Neutrophil %: 59.2 %
PLATELETS: 359 10*3/uL (ref 150–440)
RBC: 4.39 10*6/uL (ref 3.80–5.20)
RDW: 14.1 % (ref 11.5–14.5)
WBC: 6.4 10*3/uL (ref 3.6–11.0)

## 2014-06-26 LAB — WET PREP, GENITAL

## 2014-06-27 LAB — GC/CHLAMYDIA PROBE AMP

## 2014-06-30 ENCOUNTER — Emergency Department: Payer: Self-pay | Admitting: Emergency Medicine

## 2014-07-20 NOTE — L&D Delivery Note (Addendum)
Delivery Note At 10:16 AM a vigorous female was delivered via Vaginal, Spontaneous Delivery (Presentation: Left Occiput Anterior).  APGAR: 8, 9; weight 3000g .   Placenta status: Intact, Spontaneous.  Cord: 3 vessels with the following complications: Short cord and thick meconium stained amniotic fluid. Deep suction and/or resuscitation not required  Anesthesia: None  Episiotomy: None Lacerations: None Suture Repair: non applicable Est. Blood Loss (mL): 400  Mom to recovery.  Baby to Couplet care / Skin to Skin.  Christina Roth 04/23/2015, 10:40 AM

## 2014-11-26 ENCOUNTER — Emergency Department
Admission: EM | Admit: 2014-11-26 | Discharge: 2014-11-26 | Disposition: A | Discharge status: Home / Self Care | Payer: Medicaid Other | Attending: Emergency Medicine | Admitting: Emergency Medicine

## 2014-11-26 DIAGNOSIS — R51 Headache: Secondary | ICD-10-CM | POA: Diagnosis not present

## 2014-11-26 DIAGNOSIS — R42 Dizziness and giddiness: Secondary | ICD-10-CM | POA: Diagnosis not present

## 2014-11-26 DIAGNOSIS — R519 Headache, unspecified: Secondary | ICD-10-CM

## 2014-11-26 DIAGNOSIS — Z3A15 15 weeks gestation of pregnancy: Secondary | ICD-10-CM | POA: Diagnosis not present

## 2014-11-26 DIAGNOSIS — O9989 Other specified diseases and conditions complicating pregnancy, childbirth and the puerperium: Secondary | ICD-10-CM | POA: Diagnosis not present

## 2014-11-26 DIAGNOSIS — O26892 Other specified pregnancy related conditions, second trimester: Secondary | ICD-10-CM

## 2014-11-26 DIAGNOSIS — Z79899 Other long term (current) drug therapy: Secondary | ICD-10-CM | POA: Insufficient documentation

## 2014-11-26 HISTORY — DX: Reserved for concepts with insufficient information to code with codable children: IMO0002

## 2014-11-26 LAB — CBC
HCT: 36.1 % (ref 35.0–47.0)
HEMOGLOBIN: 11.8 g/dL — AB (ref 12.0–16.0)
MCH: 28.4 pg (ref 26.0–34.0)
MCHC: 32.6 g/dL (ref 32.0–36.0)
MCV: 87.2 fL (ref 80.0–100.0)
PLATELETS: 309 10*3/uL (ref 150–440)
RBC: 4.14 MIL/uL (ref 3.80–5.20)
RDW: 15.1 % — ABNORMAL HIGH (ref 11.5–14.5)
WBC: 9.1 10*3/uL (ref 3.6–11.0)

## 2014-11-26 LAB — BASIC METABOLIC PANEL
ANION GAP: 7 (ref 5–15)
BUN: 7 mg/dL (ref 6–20)
CALCIUM: 9.3 mg/dL (ref 8.9–10.3)
CO2: 25 mmol/L (ref 22–32)
Chloride: 105 mmol/L (ref 101–111)
Creatinine, Ser: 0.5 mg/dL (ref 0.44–1.00)
Glucose, Bld: 115 mg/dL — ABNORMAL HIGH (ref 65–99)
POTASSIUM: 4.1 mmol/L (ref 3.5–5.1)
SODIUM: 137 mmol/L (ref 135–145)

## 2014-11-26 LAB — HCG, QUANTITATIVE, PREGNANCY: HCG, BETA CHAIN, QUANT, S: 64314 m[IU]/mL — AB (ref <5)

## 2014-11-26 MED ORDER — METOCLOPRAMIDE HCL 10 MG PO TABS: 10.0000 mg | ORAL_TABLET | Freq: Three times a day (TID) | ORAL | Status: DC

## 2014-11-26 MED ORDER — METOCLOPRAMIDE HCL 5 MG/ML IJ SOLN
10.0000 mg | Freq: Once | INTRAMUSCULAR | Status: AC
  Administered 2014-11-26: 10 mg via INTRAVENOUS

## 2014-11-26 MED ORDER — METOCLOPRAMIDE HCL 5 MG/ML IJ SOLN
INTRAMUSCULAR | Status: AC
  Administered 2014-11-26: 10 mg via INTRAVENOUS
  Filled 2014-11-26: qty 2

## 2014-11-26 MED ORDER — SODIUM CHLORIDE 0.9 % IV SOLN
Freq: Once | INTRAVENOUS | Status: AC
  Administered 2014-11-26: 13:00:00 via INTRAVENOUS

## 2014-11-26 MED ORDER — DIPHENHYDRAMINE HCL 50 MG/ML IJ SOLN
25.0000 mg | Freq: Once | INTRAMUSCULAR | Status: AC
  Administered 2014-11-26: 25 mg via INTRAVENOUS

## 2014-11-26 MED ORDER — DIPHENHYDRAMINE HCL 50 MG/ML IJ SOLN
INTRAMUSCULAR | Status: AC
  Administered 2014-11-26: 25 mg via INTRAVENOUS
  Filled 2014-11-26: qty 1

## 2014-11-26 NOTE — Discharge Instructions (Signed)
General Headache Without Cause  A general headache is pain or discomfort felt around the head or neck area. The cause may not be found.   HOME CARE   · Keep all doctor visits.  · Only take medicines as told by your doctor.  · Lie down in a dark, quiet room when you have a headache.  · Keep a journal to find out if certain things bring on headaches. For example, write down:  ¨ What you eat and drink.  ¨ How much sleep you get.  ¨ Any change to your diet or medicines.  · Relax by getting a massage or doing other relaxing activities.  · Put ice or heat packs on the head and neck area as told by your doctor.  · Lessen stress.  · Sit up straight. Do not tighten (tense) your muscles.  · Quit smoking if you smoke.  · Lessen how much alcohol you drink.  · Lessen how much caffeine you drink, or stop drinking caffeine.  · Eat and sleep on a regular schedule.  · Get 7 to 9 hours of sleep, or as told by your doctor.  · Keep lights dim if bright lights bother you or make your headaches worse.  GET HELP RIGHT AWAY IF:   · Your headache becomes really bad.  · You have a fever.  · You have a stiff neck.  · You have trouble seeing.  · Your muscles are weak, or you lose muscle control.  · You lose your balance or have trouble walking.  · You feel like you will pass out (faint), or you pass out.  · You have really bad symptoms that are different than your first symptoms.  · You have problems with the medicines given to you by your doctor.  · Your medicines do not work.  · Your headache feels different than the other headaches.  · You feel sick to your stomach (nauseous) or throw up (vomit).  MAKE SURE YOU:   · Understand these instructions.  · Will watch your condition.  · Will get help right away if you are not doing well or get worse.  Document Released: 04/14/2008 Document Revised: 09/28/2011 Document Reviewed: 06/26/2011  ExitCare® Patient Information ©2015 [employer]. This information is not intended to replace advice given to  you by your health care provider. Make sure you discuss any questions you have with your health care provider.

## 2014-11-26 NOTE — ED Provider Notes (Signed)
[facility] Emergency Department Provider Note    Time seen: 1300  I have reviewed the triage vital signs and the nursing notes.   HISTORY  Chief Complaint Migraine    HPI Christina Roth is a 24 y.o. female who presents here for migraine headache for the past week. She's had dizzy spells for the past week and she is [redacted] weeks pregnant. Pain is a top for head is dull light bothers her sound bothers here, she has a history of same but doesn't usually last this long. She does describe difficulty sleeping over the last week. Headache is severe at this time.     Past Medical History  Diagnosis Date  . HPV test positive     There are no active problems to display for this patient.   History reviewed. No pertinent past surgical history.  Current Outpatient Rx  Name  Route  Sig  Dispense  Refill  . Prenatal Vit-Fe Fumarate-FA (PRENATAL MULTIVITAMIN) TABS tablet   Oral   Take 1 tablet by mouth daily at 12 noon.           Allergies Review of patient's allergies indicates no known allergies.  No family history on file.  Social History History  Substance Use Topics  . Smoking status: Never Smoker   . Smokeless tobacco: Not on file  . Alcohol Use: No    Review of Systems Constitutional: Negative for fever. Eyes: Negative for visual changes. ENT: Negative for sore throat. Cardiovascular: Negative for chest pain. Respiratory: Negative for shortness of breath. Gastrointestinal: Negative for abdominal pain, vomiting and diarrhea. Genitourinary: Negative for dysuria. Musculoskeletal: Negative for back pain. Skin: Negative for rash. Neurological: Positive for headaches, focal weakness or numbness.  10-point ROS otherwise negative.  ____________________________________________   PHYSICAL EXAM:  VITAL SIGNS: ED Triage Vitals  Enc Vitals Group     BP 11/26/14 1106 105/69 mmHg     Pulse Rate 11/26/14 1106 75     Resp 11/26/14 1106 15    Temp 11/26/14 1106 98 F (36.7 C)     Temp Source 11/26/14 1106 Oral     SpO2 11/26/14 1106 100 %     Weight 11/26/14 1106 120 lb (54.432 kg)     Height 11/26/14 1106 5\' 8"  (1.727 m)     Head Cir --      Peak Flow --      Pain Score 11/26/14 1106 7     Pain Loc --      Pain Edu? --      Excl. in GC? --     Constitutional: Alert and oriented. Well appearing and in no distress. Eyes: Conjunctivae are normal. PERRL. photophobia is present ENT   Head: Normocephalic and atraumatic.   Nose: No congestion/rhinnorhea.   Mouth/Throat: Mucous membranes are moist.   Neck: No stridor. Hematological/Lymphatic/Immunilogical: No cervical lymphadenopathy. Cardiovascular: Normal rate, regular rhythm. Normal and symmetric distal pulses are present in all extremities. No murmurs, rubs, or gallops. Respiratory: Normal respiratory effort without tachypnea nor retractions. Breath sounds are clear and equal bilaterally. No wheezes/rales/rhonchi. Gastrointestinal: Soft and nontender. No distention. No abdominal bruits. There is no CVA tenderness. Musculoskeletal: Nontender with normal range of motion in all extremities. No joint effusions.  No lower extremity tenderness nor edema. Neurologic:  Normal speech and language. No gross focal neurologic deficits are appreciated. Speech is normal. No gait instability. Skin:  Skin is warm, dry and intact. No rash noted. Psychiatric: Mood and affect are normal.  Speech and behavior are normal. Patient exhibits appropriate insight and judgment.  ____________________________________________    LABS (pertinent positives/negatives)  Labs Reviewed  CBC - Abnormal; Notable for the following:    Hemoglobin 11.8 (*)    RDW 15.1 (*)    All other components within normal limits  BASIC METABOLIC PANEL - Abnormal; Notable for the following:    Glucose, Bld 115 (*)    All other components within normal limits  HCG, QUANTITATIVE, PREGNANCY - Abnormal; Notable  for the following:    hCG, Beta Chain, Quant, [place] [id number] (*)    All other components within normal limits     ____________________________________________    RADIOLOGY  None  ____________________________________________    ED COURSE  Pertinent labs & imaging results that were available during my care of the patient were reviewed by me and considered in my medical decision making (see chart for details).  IV fluid, IV Reglan and Benadryl. We'll reevaluate.  FINAL ASSESSMENT AND PLAN  Assessment: Headache and pregnancy  Plan: Patient is improved with discharge Reglan which should treat any nausea she has as well as headaches. Patient is advised to have close follow-up with her primary care doctor.    Emily FilbertWilliams, Jonathan E, MD   Emily FilbertJonathan E Williams, MD 11/26/14 [phone]

## 2014-11-26 NOTE — ED Notes (Signed)
Patient resting in stretcher. Respirations even and unlabored. No needs/concerns verbalized at this time. Family at bedside. Call bell within reach. Encouraged to call with needs. Will continue to monitor.

## 2014-11-26 NOTE — ED Notes (Signed)
D/c instructions reviewed w/ pt - pt denies any further questions or concerns at present.   

## 2014-11-26 NOTE — ED Notes (Signed)
Pt c/o migraines for the past week with dizzy spells for the past week..states she is [redacted] weeks pregnant ..denies a hx of migraines

## 2014-11-27 NOTE — H&P (Signed)
L&D Evaluation:  History:   HPI [age] yo G3P2002 at 386w4d gestational age with EDC of 04/11/12, with uncomplicated pregnancy, presents with regular uterine contractions.  Notes positive fetal movement, no leakage of fluid, no vaginal bleeding.  [facility]  She has a personal history of genital herpes, has been on acyclovir for past month.  Denies current lesions are current painful sores.  Blood type O+, RI, VZI (by history of chicken pox), RPR NR, HBsAg neg, GBS neg.    Patient's Medical History Herpes genetalis    Patient's Surgical History none    Medications Pre Natal Vitamins  acyclovir 400mg  bid    Allergies NKDA    Social History none    Family History Non-Contributory   ROS:   ROS All systems were reviewed.  HEENT, CNS, GI, GU, Respiratory, CV, Renal and Musculoskeletal systems were found to be normal., unless noted in HPI   Exam:   Vital Signs stable    General mild distress w/ contractions    Mental Status clear    Chest clear    Heart normal sinus rhythm    Abdomen gravid, tender with contractions    Estimated Fetal Weight Average for gestational age, 6.5#    Back no CVAT    Edema no edema    Pelvic no external lesions, 5cm per RN, SSE performed=no lesions on cervix, vagina, vulva    Mebranes Intact    FHT normal rate with no decels    FHT Description 125/mod var/+accels/no decels    Ucx regular, q5 min    Skin no lesions   Impression:   Impression active labor   Plan:   Plan EFM/NST, monitor contractions and for cervical change, fluids    Comments admit for labor patient does not desire epidural herpes-no lesions CBC, T&S now IVF, clears GBS neg by recent GBS (within about 2 weeks) female fetus   Electronic Signatures: Conard NovakJackson, Jearld Hemp D (MD)  (Signed 30-Aug-13 03:20)  Authored: L&D Evaluation   Last Updated: 30-Aug-13 03:20 by Conard NovakJackson, Lashawn Orrego D (MD)

## 2015-02-12 LAB — OB RESULTS CONSOLE HEPATITIS B SURFACE ANTIGEN: HEP B S AG: NEGATIVE

## 2015-02-12 LAB — OB RESULTS CONSOLE HIV ANTIBODY (ROUTINE TESTING): HIV: NONREACTIVE

## 2015-02-12 LAB — OB RESULTS CONSOLE RPR: RPR: NONREACTIVE

## 2015-04-01 LAB — OB RESULTS CONSOLE GC/CHLAMYDIA
Chlamydia: NEGATIVE
Gonorrhea: NEGATIVE

## 2015-04-01 LAB — OB RESULTS CONSOLE HIV ANTIBODY (ROUTINE TESTING): HIV: NONREACTIVE

## 2015-04-01 LAB — OB RESULTS CONSOLE ABO/RH: RH Type: POSITIVE

## 2015-04-01 LAB — OB RESULTS CONSOLE GBS: STREP GROUP B AG: NEGATIVE

## 2015-04-23 ENCOUNTER — Encounter: Payer: Self-pay | Admitting: *Deleted

## 2015-04-23 ENCOUNTER — Other Ambulatory Visit: Payer: Self-pay | Admitting: Obstetrics and Gynecology

## 2015-04-23 ENCOUNTER — Inpatient Hospital Stay
Admission: EM | Admit: 2015-04-23 | Discharge: 2015-04-25 | DRG: 775 | Disposition: A | Discharge status: Home / Self Care | Payer: Medicaid Other | Attending: Certified Nurse Midwife | Admitting: Certified Nurse Midwife

## 2015-04-23 DIAGNOSIS — I959 Hypotension, unspecified: Secondary | ICD-10-CM | POA: Diagnosis present

## 2015-04-23 DIAGNOSIS — Z87891 Personal history of nicotine dependence: Secondary | ICD-10-CM | POA: Diagnosis not present

## 2015-04-23 DIAGNOSIS — O4292 Full-term premature rupture of membranes, unspecified as to length of time between rupture and onset of labor: Secondary | ICD-10-CM | POA: Diagnosis present

## 2015-04-23 DIAGNOSIS — Z3A37 37 weeks gestation of pregnancy: Secondary | ICD-10-CM

## 2015-04-23 DIAGNOSIS — Z302 Encounter for sterilization: Secondary | ICD-10-CM | POA: Diagnosis present

## 2015-04-23 DIAGNOSIS — O135 Gestational [pregnancy-induced] hypertension without significant proteinuria, complicating the puerperium: Secondary | ICD-10-CM | POA: Diagnosis not present

## 2015-04-23 HISTORY — DX: Major depressive disorder, single episode, unspecified: F32.9

## 2015-04-23 HISTORY — DX: Papillomavirus as the cause of diseases classified elsewhere: B97.7

## 2015-04-23 HISTORY — DX: Herpesviral infection, unspecified: B00.9

## 2015-04-23 HISTORY — DX: Excoriation (skin-picking) disorder: F42.4

## 2015-04-23 HISTORY — DX: Depression, unspecified: F32.A

## 2015-04-23 HISTORY — DX: Anemia, unspecified: D64.9

## 2015-04-23 HISTORY — DX: Factitial dermatitis: L98.1

## 2015-04-23 LAB — TYPE AND SCREEN
ABO/RH(D): O POS
ANTIBODY SCREEN: NEGATIVE

## 2015-04-23 LAB — COMPREHENSIVE METABOLIC PANEL
ALK PHOS: 289 U/L — AB (ref 38–126)
ALT: 33 U/L (ref 14–54)
AST: 44 U/L — AB (ref 15–41)
Albumin: 2.1 g/dL — ABNORMAL LOW (ref 3.5–5.0)
Anion gap: 6 (ref 5–15)
BUN: 7 mg/dL (ref 6–20)
CALCIUM: 8.3 mg/dL — AB (ref 8.9–10.3)
CO2: 22 mmol/L (ref 22–32)
CREATININE: 0.8 mg/dL (ref 0.44–1.00)
Chloride: 111 mmol/L (ref 101–111)
Glucose, Bld: 122 mg/dL — ABNORMAL HIGH (ref 65–99)
Potassium: 3.5 mmol/L (ref 3.5–5.1)
Sodium: 139 mmol/L (ref 135–145)
Total Bilirubin: 1 mg/dL (ref 0.3–1.2)
Total Protein: 5.6 g/dL — ABNORMAL LOW (ref 6.5–8.1)

## 2015-04-23 LAB — CBC
HCT: 34.5 % — ABNORMAL LOW (ref 35.0–47.0)
HEMATOCRIT: 36.6 % (ref 35.0–47.0)
HEMOGLOBIN: 12.2 g/dL (ref 12.0–16.0)
Hemoglobin: 11.7 g/dL — ABNORMAL LOW (ref 12.0–16.0)
MCH: 29.2 pg (ref 26.0–34.0)
MCH: 29.6 pg (ref 26.0–34.0)
MCHC: 33.4 g/dL (ref 32.0–36.0)
MCHC: 33.8 g/dL (ref 32.0–36.0)
MCV: 87.3 fL (ref 80.0–100.0)
MCV: 87.6 fL (ref 80.0–100.0)
PLATELETS: 204 10*3/uL (ref 150–440)
Platelets: 203 10*3/uL (ref 150–440)
RBC: 4.19 MIL/uL (ref 3.80–5.20)
RBC: 3.94 MIL/uL (ref 3.80–5.20)
RDW: 14.1 % (ref 11.5–14.5)
RDW: 14 % (ref 11.5–14.5)
WBC: 8.3 10*3/uL (ref 3.6–11.0)
WBC: 9 10*3/uL (ref 3.6–11.0)

## 2015-04-23 LAB — PROTEIN / CREATININE RATIO, URINE: Total Protein, Urine: 11 mg/dL

## 2015-04-23 LAB — ABO/RH: ABO/RH(D): O POS

## 2015-04-23 MED ORDER — LACTATED RINGERS IV SOLN
INTRAVENOUS | Status: DC
  Administered 2015-04-23: 02:00:00 via INTRAVENOUS

## 2015-04-23 MED ORDER — CITRIC ACID-SODIUM CITRATE 334-500 MG/5ML PO SOLN: 30.0000 mL | ORAL | Status: DC | PRN

## 2015-04-23 MED ORDER — PRENATAL MULTIVITAMIN CH
1.0000 | ORAL_TABLET | Freq: Every day | ORAL | Status: DC
  Administered 2015-04-24 – 2015-04-25 (×2): 1 via ORAL
  Filled 2015-04-23 (×2): qty 1

## 2015-04-23 MED ORDER — LACTATED RINGERS IV SOLN: 500.0000 mL | INTRAVENOUS | Status: DC | PRN

## 2015-04-23 MED ORDER — TERBUTALINE SULFATE 1 MG/ML IJ SOLN: 0.2500 mg | Freq: Once | INTRAMUSCULAR | Status: DC | PRN

## 2015-04-23 MED ORDER — FERROUS SULFATE 325 (65 FE) MG PO TABS: 325.0000 mg | ORAL_TABLET | Freq: Every day | ORAL | Status: DC

## 2015-04-23 MED ORDER — OXYTOCIN 10 UNIT/ML IJ SOLN
INTRAMUSCULAR | Status: AC
  Filled 2015-04-23: qty 2

## 2015-04-23 MED ORDER — ACETAMINOPHEN 325 MG PO TABS: 650.0000 mg | ORAL_TABLET | ORAL | Status: DC | PRN

## 2015-04-23 MED ORDER — ONDANSETRON HCL 4 MG/2ML IJ SOLN
4.0000 mg | Freq: Four times a day (QID) | INTRAMUSCULAR | Status: DC | PRN
  Administered 2015-04-23: 4 mg via INTRAVENOUS

## 2015-04-23 MED ORDER — NIFEDIPINE ER OSMOTIC RELEASE 30 MG PO TB24
30.0000 mg | ORAL_TABLET | Freq: Every day | ORAL | Status: DC
  Administered 2015-04-23: 30 mg via ORAL
  Filled 2015-04-23: qty 1

## 2015-04-23 MED ORDER — OXYCODONE-ACETAMINOPHEN 5-325 MG PO TABS
ORAL_TABLET | ORAL | Status: AC
  Administered 2015-04-23: 1 via ORAL
  Filled 2015-04-23: qty 1

## 2015-04-23 MED ORDER — BUTORPHANOL TARTRATE 1 MG/ML IJ SOLN
1.0000 mg | INTRAMUSCULAR | Status: DC | PRN
  Administered 2015-04-23: 1 mg via INTRAVENOUS

## 2015-04-23 MED ORDER — ACETAMINOPHEN 325 MG PO TABS
650.0000 mg | ORAL_TABLET | Freq: Four times a day (QID) | ORAL | Status: DC | PRN
  Administered 2015-04-23 – 2015-04-25 (×4): 650 mg via ORAL
  Filled 2015-04-23 (×4): qty 2

## 2015-04-23 MED ORDER — BUTORPHANOL TARTRATE 1 MG/ML IJ SOLN
2.0000 mg | INTRAMUSCULAR | Status: DC | PRN
  Administered 2015-04-23: 2 mg via INTRAVENOUS
  Filled 2015-04-23: qty 2

## 2015-04-23 MED ORDER — OXYTOCIN BOLUS FROM INFUSION
500.0000 mL | INTRAVENOUS | Status: DC
  Administered 2015-04-23: 500 mL via INTRAVENOUS

## 2015-04-23 MED ORDER — OXYCODONE-ACETAMINOPHEN 5-325 MG PO TABS
1.0000 | ORAL_TABLET | ORAL | Status: DC | PRN
  Administered 2015-04-23: 1 via ORAL

## 2015-04-23 MED ORDER — SIMETHICONE 80 MG PO CHEW: 80.0000 mg | CHEWABLE_TABLET | ORAL | Status: DC | PRN

## 2015-04-23 MED ORDER — BUTORPHANOL TARTRATE 1 MG/ML IJ SOLN
INTRAMUSCULAR | Status: AC
  Administered 2015-04-23: 2 mg via INTRAVENOUS
  Filled 2015-04-23: qty 2

## 2015-04-23 MED ORDER — AMMONIA AROMATIC IN INHA
RESPIRATORY_TRACT | Status: AC
  Filled 2015-04-23: qty 10

## 2015-04-23 MED ORDER — IBUPROFEN 600 MG PO TABS
600.0000 mg | ORAL_TABLET | Freq: Four times a day (QID) | ORAL | Status: DC
  Administered 2015-04-23: 600 mg via ORAL
  Filled 2015-04-23: qty 1

## 2015-04-23 MED ORDER — DOCUSATE SODIUM 100 MG PO CAPS
100.0000 mg | ORAL_CAPSULE | Freq: Two times a day (BID) | ORAL | Status: DC
  Administered 2015-04-23 – 2015-04-25 (×4): 100 mg via ORAL
  Filled 2015-04-23 (×4): qty 1

## 2015-04-23 MED ORDER — LANOLIN HYDROUS EX OINT: TOPICAL_OINTMENT | CUTANEOUS | Status: DC | PRN

## 2015-04-23 MED ORDER — OXYTOCIN 40 UNITS IN LACTATED RINGERS INFUSION - SIMPLE MED
1.0000 m[IU]/min | INTRAVENOUS | Status: DC
  Administered 2015-04-23: 1 m[IU]/min via INTRAVENOUS
  Filled 2015-04-23: qty 1000

## 2015-04-23 MED ORDER — BENZOCAINE-MENTHOL 20-0.5 % EX AERO: 1.0000 "application " | INHALATION_SPRAY | CUTANEOUS | Status: DC | PRN

## 2015-04-23 MED ORDER — OXYCODONE HCL 5 MG PO TABS
5.0000 mg | ORAL_TABLET | Freq: Four times a day (QID) | ORAL | Status: DC | PRN
  Administered 2015-04-23 – 2015-04-24 (×4): 10 mg via ORAL
  Administered 2015-04-25: 5 mg via ORAL
  Filled 2015-04-23 (×2): qty 2
  Filled 2015-04-23: qty 5
  Filled 2015-04-23 (×2): qty 2

## 2015-04-23 MED ORDER — ONDANSETRON HCL 4 MG/2ML IJ SOLN: 4.0000 mg | INTRAMUSCULAR | Status: DC | PRN

## 2015-04-23 MED ORDER — LIDOCAINE HCL (PF) 1 % IJ SOLN
INTRAMUSCULAR | Status: AC
  Filled 2015-04-23: qty 30

## 2015-04-23 MED ORDER — ONDANSETRON HCL 4 MG/2ML IJ SOLN
INTRAMUSCULAR | Status: AC
  Administered 2015-04-23: 4 mg via INTRAVENOUS
  Filled 2015-04-23: qty 2

## 2015-04-23 MED ORDER — MISOPROSTOL 200 MCG PO TABS
ORAL_TABLET | ORAL | Status: AC
  Filled 2015-04-23: qty 4

## 2015-04-23 MED ORDER — WITCH HAZEL-GLYCERIN EX PADS: 1.0000 "application " | MEDICATED_PAD | CUTANEOUS | Status: DC | PRN

## 2015-04-23 MED ORDER — OXYTOCIN 40 UNITS IN LACTATED RINGERS INFUSION - SIMPLE MED: 62.5000 mL/h | INTRAVENOUS | Status: DC | PRN

## 2015-04-23 MED ORDER — DIBUCAINE 1 % RE OINT: 1.0000 "application " | TOPICAL_OINTMENT | RECTAL | Status: DC | PRN

## 2015-04-23 MED ORDER — OXYTOCIN 40 UNITS IN LACTATED RINGERS INFUSION - SIMPLE MED: 62.5000 mL/h | INTRAVENOUS | Status: DC

## 2015-04-23 MED ORDER — OXYCODONE-ACETAMINOPHEN 5-325 MG PO TABS
2.0000 | ORAL_TABLET | ORAL | Status: DC | PRN
  Administered 2015-04-23: 2 via ORAL
  Filled 2015-04-23: qty 2

## 2015-04-23 MED ORDER — LIDOCAINE HCL (PF) 1 % IJ SOLN: 30.0000 mL | INTRAMUSCULAR | Status: DC | PRN

## 2015-04-23 MED ORDER — ONDANSETRON HCL 4 MG PO TABS: 4.0000 mg | ORAL_TABLET | ORAL | Status: DC | PRN

## 2015-04-23 NOTE — Discharge Summary (Signed)
Physician Obstetric Discharge Summary  Patient ID: SAFIRA PROFFIT MRN: [id number] DOB/AGE: 26-14-1992 26 y.o.   Date of Admission: 04/23/2015  Date of Discharge: 04/25/15  Admitting Diagnosis: Premature rupture of membrane at [redacted]w[redacted]d Secondary Diagnosis: Thick meconium stained amniotic fluid  Mode of Delivery: normal spontaneous vaginal delivery 04/23/2015       Discharge Diagnosis: Term intrauterine pregnancy delivered at 37 weeks   Intrapartum Procedures: pitocin augmentation   Post partum procedures: none  Complications: Elevated BPs on PPD #0, given Procardia XL 30 mg at HS. Pulse low and pt hypotensive after this, no other meds given. BP stable for the past 24 hours. Will not send home on meds, [facility]    Brief Hospital Course  LMYDA DETWILERis a [id number] had a SVD on 04/23/2015;  for further details of this delivery, please refer to the delivery note.  See above note re: PP course.  By time of discharge on PPD#2, her pain was controlled on oral pain medications; she had appropriate lochia and was ambulating, voiding without difficulty and tolerating regular diet.  She was deemed stable for discharge to home.    Results for orders placed or performed during the hospital encounter of 04/23/15 (from the past 72 hour(s))  CBC     Status: None   Collection Time: 04/23/15  2:10 AM  Result Value Ref Range   WBC 8.3 3.6 - 11.0 K/uL   RBC 4.19 3.80 - 5.20 MIL/uL   Hemoglobin 12.2 12.0 - 16.0 g/dL   HCT 36.6 35.0 - 47.0 %   MCV 87.3 80.0 - 100.0 fL   MCH 29.2 26.0 - 34.0 pg   MCHC 33.4 32.0 - 36.0 g/dL   RDW 14.1 11.5 - 14.5 %   Platelets 203 150 - 440 K/uL  Type and screen     Status: None   Collection Time: 04/23/15  2:10 AM  Result Value Ref Range   ABO/RH(D) O POS    Antibody Screen NEG    Sample Expiration 04/26/2015   RPR     Status: None   Collection Time: 04/23/15  2:10 AM  Result Value Ref Range   RPR Ser Ql Non  Reactive Non Reactive    Comment: (NOTE) Performed At: [id number] RompMD PFH:[id number]  Protein / creatinine ratio, urine     Status: None   Collection Time: 04/23/15  6:11 PM  Result Value Ref Range   Creatinine, Urine <10 mg/dL   Total Protein, Urine 11 mg/dL    Comment: NO NORMAL RANGE ESTABLISHED FOR THIS TEST   Protein Creatinine Ratio        0.00 - 0.15 mg/mg[Cre]    Comment: RESULT BELOW REPORTABLE RANGE, UNABLE TO CALCULATE.   Comprehensive metabolic panel     Status: Abnormal   Collection Time: 04/23/15  6:21 PM  Result Value Ref Range   Sodium 139 135 - 145 mmol/L   Potassium 3.5 3.5 - 5.1 mmol/L   Chloride 111 101 - 111 mmol/L   CO2 22 22 - 32 mmol/L   Glucose, Bld 122 (H) 65 - 99 mg/dL   BUN 7 6 - 20 mg/dL   Creatinine, Ser 0.80 0.44 - 1.00 mg/dL   Calcium 8.3 (L) 8.9 - 10.3 mg/dL   Total Protein 5.6 (L) 6.5 - 8.1 g/dL   Albumin 2.1 (L) 3.5 - 5.0 g/dL  AST 44 (H) 15 - 41 U/L   ALT 33 14 - 54 U/L   Alkaline Phosphatase 289 (H) 38 - 126 U/L   Total Bilirubin 1.0 0.3 - 1.2 mg/dL   GFR calc non Af Amer >60 >60 mL/min   GFR calc Af Amer >60 >60 mL/min    Comment: (NOTE) The eGFR has been calculated using the CKD EPI equation. This calculation has not been validated in all clinical situations. eGFR's persistently <60 mL/min signify possible Chronic Kidney Disease.    Anion gap 6 5 - 15  CBC     Status: Abnormal   Collection Time: 04/23/15  6:21 PM  Result Value Ref Range   WBC 9.0 3.6 - 11.0 K/uL   RBC 3.94 3.80 - 5.20 MIL/uL   Hemoglobin 11.7 (L) 12.0 - 16.0 g/dL   HCT 34.5 (L) 35.0 - 47.0 %   MCV 87.6 80.0 - 100.0 fL   MCH 29.6 26.0 - 34.0 pg   MCHC 33.8 32.0 - 36.0 g/dL   RDW 14.0 11.5 - 14.5 %   Platelets 204 150 - 440 K/uL     O POS  Physical exam:  Blood pressure 123/85, pulse 47, temperature 98.2 F (36.8 C), temperature source Oral, resp. rate 18, height _0  (1.727 m),  weight 145 lb (65.772 kg), General: alert and no distress Lochia: appropriate Abdomen: soft, NT Uterine Fundus: firm Extremities: No evidence of DVT seen on physical exam. No lower extremity edema.  Discharge Instructions:   Call office if you have any of the following: headache, visual changes, fever >100 F, chills, breast concerns, excessive vaginal bleeding, incision drainage or problems, leg pain or redness, depression or any other concerns.   Activity: Do not lift > 10 lbs for 6 weeks.  No intercourse or tampons for 6 weeks.  No driving for 1-2 weeks.   Activity: Advance as tolerated. Pelvic rest for 6 weeks - until BTL is done.  Also refer to Discharge Instructions Diet: Regular Medications:   Medication List    STOP taking these medications        ferrous sulfate 325 (65 FE) MG tablet     valACYclovir 500 MG tablet  Commonly known as:  VALTREX      TAKE these medications        acetaminophen 500 MG tablet  Commonly known as:  TYLENOL  Take 500 mg by mouth every 6 (six) hours as needed for headache.     ibuprofen 600 MG tablet  Commonly known as:  ADVIL,MOTRIN  Take 1 tablet (600 mg total) by mouth every 6 (six) hours as needed.     oxyCODONE 5 MG immediate release tablet  Commonly known as:  Oxy IR/ROXICODONE  Take 1 tablet (5 mg total) by mouth every 6 (six) hours as needed for severe pain.     prenatal multivitamin Tabs tablet  Take 1 tablet by mouth daily at 12 noon.        Outpatient follow up: 1 week BP check at [facility], screening for PP depression and plan for Interval BTL Postpartum contraception: bilateral tubal ligation - unable to obtain Medicaid consent, pt will need to schedule through [facility]  Discharged Condition: good  Discharged to: home   Newborn Data: Disposition:home with mother  Apgars: APGAR (1 MIN): 8   APGAR (5 MINS): 9   APGAR (10 MINS):    Baby Feeding: Bottle and Breast   Burlene Arnt, CNM

## 2015-04-23 NOTE — H&P (Signed)
Obstetric History and Physical  Christina Roth is a 24 y.o. [phone] with Estimated Date of Delivery: 05/12/15 who presents at [redacted]w[redacted]d  presenting for SROM at 0100 this am with meconium and contractions. Patient states she has been having regular contractions, no vaginal bleeding, active fetal movement.    Prenatal Course Source of Care: [facility]. 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 Labor and delivery, indication for care 04/23/2015   She plans to breastfeed, plans to bottle feed She desires bilateral tubal ligation for postpartum contraception.   Prenatal labs and studies: ABO, Rh: O+  Antibody: Negative Rubella: Immune Varicella: unknown RPR:  NR HBsAg:  Negative HIV: negative GC/CT: Neg/Neg GBS: Neg 1 hr Glucola: 105     Prenatal Transfer Tool   Past Medical History  Diagnosis Date  . HPV test positive   . HPV (human papilloma virus) infection   . Anemia   . Depression   . Dermatillomania   . Herpes     Past Surgical History  Procedure Laterality Date  . No past surgeries      OB History  Gravida Para Term Preterm AB SAB TAB Ectopic Multiple Living  # Outcome Date GA Lbr Len/2nd Weight Sex Delivery Anes PTL Lv  4 Current           3 Preterm 03/18/12 [redacted]w[redacted]d   M    Y  2 Preterm 06/05/09 [redacted]w[redacted]d   M    Y  1 Preterm 12/15/07 [redacted]w[redacted]d   M Vag-Spont   Y     Complications: No prenatal care in current pregnancy      Social History   Social History  . Marital Status: Married    Spouse Name: N/A  . Number of Children: N/A  . Years of Education: N/A   Social History Main Topics  . Smoking status: Former Smoker    Quit date: 04/22/2013  . Smokeless tobacco: Never Used  . Alcohol Use: No  .  Drug Use: No  . Sexual Activity: Yes   Other Topics Concern  . None   Social History Narrative    History reviewed. No pertinent family history.  Prescriptions prior to admission  Medication Sig Dispense Refill Last Dose  . ferrous sulfate 325 (65 FE) MG tablet Take 325 mg by mouth daily with breakfast.     . Prenatal Vit-Fe Fumarate-FA (PRENATAL MULTIVITAMIN) TABS tablet Take 1 tablet by mouth daily at 12 noon.   11/25/2014  . valACYclovir (VALTREX) 500 MG tablet Take 500 mg by mouth 2 (two) times daily.     Marland Kitchen acetaminophen (TYLENOL) 500 MG tablet Take 500 mg by mouth every 6 (six) hours as needed for headache.   11/25/2014 at Unknown time  . [DISCONTINUED] metoCLOPramide (REGLAN) 10 MG tablet Take 1 tablet (10 mg total) by mouth 3 (three) times daily with meals. (Patient not taking: Reported on 04/23/2015) 30 tablet 1 Not Taking  Reports taking Rx for Ibuprofen recently for leg pain  No Known Allergies  Review of Systems: Negative except for what is mentioned in HPI.  Physical Exam: BP 144/82 mmHg  Pulse 54  Temp(Src) 98.4 F (36.9 C) (Oral)  Resp 20  Ht 5'  8" (1.727 m)  Wt 145 lb (65.772 kg)  BMI 22.05 kg/m2  LMP  (Exact Date) GENERAL: Well-developed, well-nourished female in no acute distress.  LUNGS: Clear to auscultation bilaterally.  HEART: Regular rate and rhythm. ABDOMEN: Soft, nontender, nondistended, gravid. EXTREMITIES: Nontender, no edema Cervical Exam: Dilatation 4 cm   Effacement 80 %   Station -1, SSE negative for HSV lesion Presentation: cephalic FHT: Category: 1 prior to stadol administration, Baseline rate 125 bpm   Variability moderate  Accelerations present   Decelerations none Contractions: Every 3-6 mins   Pertinent Labs/Studies:   Results for orders placed or performed during the hospital encounter of 04/23/15 (from the past 24 hour(s))  CBC     Status: None   Collection Time: 04/23/15  2:10 AM  Result Value Ref Range   WBC 8.3 3.6 - 11.0 K/uL    RBC 4.19 3.80 - 5.20 MIL/uL   Hemoglobin 12.2 12.0 - 16.0 g/dL   HCT 16.1 09.6 - 04.5 %   MCV 87.3 80.0 - 100.0 fL   MCH 29.2 26.0 - 34.0 pg   MCHC 33.4 32.0 - 36.0 g/dL   RDW 40.9 81.1 - 91.4 %   Platelets 203 150 - 440 K/uL  Type and screen     Status: None   Collection Time: 04/23/15  2:10 AM  Result Value Ref Range   ABO/RH(D) O POS    Antibody Screen NEG    Sample Expiration 04/26/2015     Assessment : IUP at [redacted]w[redacted]d SROM with meconium, early labor  Plan: Admit for labor and Observe for cervical change  IV meds/epidural as desired

## 2015-04-23 NOTE — Progress Notes (Signed)
L&D Note  04/23/2015 - 7:31 AM  24 y.o. [id number] [redacted]w[redacted]d   Ms. Christina Roth is admitted for SROM   Subjective:  Painful contractions  Objective:   Filed Vitals:   04/23/15 0217 04/23/15 0230 04/23/15 0410 04/23/15 0649  BP: 144/82  122/75 134/81  Pulse: 54  57 51  Temp: 98.4 F (36.9 C)   98.2 F (36.8 C)  TempSrc: Oral   Oral  Resp: 20   18  Height:   (1.727 m)    Weight:  145 lb (65.772 kg)      Current Vital Signs 24h Vital Sign Ranges  T 98.2 F (36.8 C) Temp  Avg: 98.3 F (36.8 C)  Min: 98.2 F (36.8 C)  Max: 98.4 F (36.9 C)  BP 134/81 mmHg BP  Min: 122/75  Max: 144/82  HR (!) 51 Pulse  Avg: 54  Min: 51  Max: 57  RR 18 Resp  Avg: 19  Min: 18  Max: 20  SaO2     No Data Recorded       24 Hour I/O Current Shift I/O  Time Ins Outs        FHR: category 1 tracing Toco: q 6-7 min SVE: 4/80/-1   Assessment :  IUP at [redacted]w[redacted]d, PROM  Plan:  Begin pitocin for PROM without cervical change for the past 3 hours.   Marta Antu, [place]

## 2015-04-23 NOTE — Progress Notes (Signed)
OB Note BPs persistently elevated. Earlier HELLP labs negative. No current chest pain, SOB, neuro s/s. Pt states that earlier she had some blurry vision and some spots in her vision but she says that she got them sometimes when she wasn't pregnant NAD, breastfeeding RRR no MRGs CTAB Abdomen: nttp, firm fundus below the umbilicus   Current Vital Signs 24h Vital Sign Ranges  T 98.3 F (36.8 C) Temp  Avg: 98.4 F (36.9 C)  Min: 98.2 F (36.8 C)  Max: 98.9 F (37.2 C)  BP (!) 161/99 mmHg BP  Min: 103/71  Max: 169/95  HR (!) 54 Pulse  Avg: 54.2  Min: 47  Max: 63  RR 19 Resp  Avg: 18.5  Min: 18  Max: 20  SaO2 100 % Not Delivered SpO2  Avg: 100 %  Min: 100 %  Max: 100 %       24 Hour I/O Current Shift I/O  Time Ins Outs   10/04 1901 - 10/05 0700 In: 900 [I.V.:900] Out: -    Patient Vitals for the past 12 hrs:  BP Temp Temp src Pulse Resp SpO2  04/23/15 2306 (!) 161/99 mmHg 98.3 F (36.8 C) Oral (!) 54 19 100 %  04/23/15 1954 (!) 135/94 mmHg 98.2 F (36.8 C) Oral (!) 55 - 100 %  04/23/15 1905 103/71 mmHg 98.9 F (37.2 C) Oral (!) 54 18 -  04/23/15 1700 (!) 143/91 mmHg - - (!) 55 - -  04/23/15 1637 (!) 161/98 mmHg 98.2 F (36.8 C) Oral 63 18 -  04/23/15 1303 (!) 136/93 mmHg 98.7 F (37.1 C) Oral (!) 58 18 100 %  04/23/15 1214 138/89 mmHg - - (!) 56 - -  04/23/15 1210 (!) 140/99 mmHg - - (!) 55 - -  04/23/15 1200 139/79 mmHg - - (!) 56 - -  04/23/15 1153 (!) 142/89 mmHg - - (!) 58 - -  04/23/15 1151 (!) 169/95 mmHg - - (!) 57 - -  04/23/15 1150 (!) 144/94 mmHg - - (!) 53 - -     Recent Labs Lab 04/23/15 0210 04/23/15 1821  WBC 8.3 9.0  HGB 12.2 11.7*  HCT 36.6 34.5*  PLT 203 204    Recent Labs Lab 04/23/15 1821  NA 139  K 3.5  CL 111  CO2 22  BUN 7  CREATININE 0.80  CALCIUM 8.3*  PROT 5.6*  BILITOT 1.0  ALKPHOS 289*  ALT 33  AST 44*  GLUCOSE 122*   PC ratio: negative  Will start procardia  qday and monitor for any severe s/s or persistent severe  range BPs. Patient delivered at approximately 1030am on 10/4.  Cornelia Copa MD [facility]  Pager: [phone]

## 2015-04-24 LAB — RPR: RPR: NONREACTIVE

## 2015-04-24 MED ORDER — SODIUM CHLORIDE 0.9 % IJ SOLN: 3.0000 mL | Freq: Three times a day (TID) | INTRAMUSCULAR | Status: DC

## 2015-04-24 MED ORDER — HYDRALAZINE HCL 10 MG PO TABS
10.0000 mg | ORAL_TABLET | Freq: Three times a day (TID) | ORAL | Status: DC
  Filled 2015-04-24 (×2): qty 1

## 2015-04-24 MED ORDER — NIFEDIPINE ER OSMOTIC RELEASE 30 MG PO TB24: 30.0000 mg | ORAL_TABLET | Freq: Every day | ORAL | Status: DC

## 2015-04-24 NOTE — Discharge Instructions (Signed)

## 2015-04-24 NOTE — Progress Notes (Signed)
OB Note Vital sign follow up. On review Some BP and HRs are low but no issues reported by nursing. Will d/c procardia and start hydralazine to start at 2200 tonight. Will continue to monitor   Patient Vitals for the past 12 hrs:  BP Temp Temp src Pulse Resp SpO2  04/24/15 0426 110/63 mmHg 98 F (36.7 C) Oral (!) 46 18 -  04/24/15 0135 (!) 93/55 mmHg - - (!) 55 - -  04/24/15 0125 (!) 88/44 mmHg - - (!) 50 - -  04/23/15 2306 (!) 161/99 mmHg 98.3 F (36.8 C) Oral (!) 54 19 100 %  04/23/15 1954 (!) 135/94 mmHg 98.2 F (36.8 C) Oral (!) 55 - 100 %  04/23/15 1905 103/71 mmHg 98.9 F (37.2 C) Oral (!) 54 18 -      Cornelia Copa MD [facility]  Pager: [phone]

## 2015-04-24 NOTE — Progress Notes (Addendum)
Post Partum Day 1 Subjective: Doing well, no complaints.  Tolerating regular diet, pain with PO meds, voiding and ambulating without difficulty.  No CP SOB F/C N/V or leg pain no HA change of vision, RUQ/epigastric pain  Objective: BP 119/75 mmHg  Pulse 77  Temp(Src) 98 F (36.7 C) (Oral)  Resp 18  SpO2 100% Physical Exam:  General: NAD CV: RRR Pulm: nl effort, CTABL Lochia: moderate Uterine Fundus: fundus firm and below umbilicus DVT Evaluation: no cords, ttp LEs    Recent Labs  04/23/15 0210 04/23/15 1821  HGB 12.2 11.7*  HCT 36.6 34.5*  WBC 8.3 9.0  PLT 203 204    Assessment/Plan: 24 y.o. [id number] postpartum day # 1  1. Doing well, continue routine post partum care 2. Patient wonders if she should continue on all the meds she was taking prior to admission.  Doesn't know what they are; not in EPIC.  Asked if someone could bring them in and we could go over them.  Pending review. 3. S/p procardia  XL last night.  Continue to monitor BPs - no s/sx of preeclampsia at this time. ----- Ranae Plumber, MD Attending Obstetrician and Gynecologist [facility]

## 2015-04-25 MED ORDER — OXYCODONE HCL 5 MG PO TABS: 5.0000 mg | ORAL_TABLET | Freq: Four times a day (QID) | ORAL | Status: DC | PRN

## 2015-04-25 MED ORDER — IBUPROFEN 600 MG PO TABS: 600.0000 mg | ORAL_TABLET | Freq: Four times a day (QID) | ORAL | Status: DC | PRN

## 2015-04-25 NOTE — Progress Notes (Signed)
Discharge instructions reviewed with pt. Pt v/u of instructions. ID bands of mom and infant matched. Prescriptions given to mother. Escorted by auxillary via w/c in stable condition. Irving Bloor RoyeRuta Hinds/6/16 [phone]

## 2016-05-06 ENCOUNTER — Emergency Department: Payer: Medicaid Other

## 2016-05-06 ENCOUNTER — Emergency Department
Admission: EM | Admit: 2016-05-06 | Discharge: 2016-05-06 | Disposition: A | Discharge status: Home / Self Care | Payer: Medicaid Other | Attending: Emergency Medicine | Admitting: Emergency Medicine

## 2016-05-06 ENCOUNTER — Encounter: Payer: Self-pay | Admitting: *Deleted

## 2016-05-06 DIAGNOSIS — Z79899 Other long term (current) drug therapy: Secondary | ICD-10-CM | POA: Insufficient documentation

## 2016-05-06 DIAGNOSIS — M940 Chondrocostal junction syndrome [Tietze]: Secondary | ICD-10-CM | POA: Insufficient documentation

## 2016-05-06 DIAGNOSIS — R0789 Other chest pain: Secondary | ICD-10-CM

## 2016-05-06 DIAGNOSIS — Z87891 Personal history of nicotine dependence: Secondary | ICD-10-CM | POA: Insufficient documentation

## 2016-05-06 LAB — CBC
HCT: 37.8 % (ref 35.0–47.0)
HEMOGLOBIN: 12.8 g/dL (ref 12.0–16.0)
MCH: 28.8 pg (ref 26.0–34.0)
MCHC: 33.8 g/dL (ref 32.0–36.0)
MCV: 85 fL (ref 80.0–100.0)
Platelets: 386 10*3/uL (ref 150–440)
RBC: 4.45 MIL/uL (ref 3.80–5.20)
RDW: 14.9 % — ABNORMAL HIGH (ref 11.5–14.5)
WBC: 6.5 10*3/uL (ref 3.6–11.0)

## 2016-05-06 LAB — BASIC METABOLIC PANEL
ANION GAP: 8 (ref 5–15)
BUN: 13 mg/dL (ref 6–20)
CALCIUM: 8.9 mg/dL (ref 8.9–10.3)
CHLORIDE: 106 mmol/L (ref 101–111)
CO2: 23 mmol/L (ref 22–32)
Creatinine, Ser: 0.81 mg/dL (ref 0.44–1.00)
GFR calc non Af Amer: >60 mL/min (ref >60)
Glucose, Bld: 99 mg/dL (ref 65–99)
Potassium: 3.6 mmol/L (ref 3.5–5.1)
Sodium: 137 mmol/L (ref 135–145)

## 2016-05-06 LAB — TROPONIN I

## 2016-05-06 LAB — FIBRIN DERIVATIVES D-DIMER (ARMC ONLY): Fibrin derivatives D-dimer (ARMC): 296 (ref 0–499)

## 2016-05-06 IMAGING — CR DG CHEST 2V
2 series · 2 of 2 positions shown · non-contrast
Comparison: None.

CLINICAL DATA: Chest pain for 3 days.

EXAM:
CHEST  2 VIEW

[chest pa]
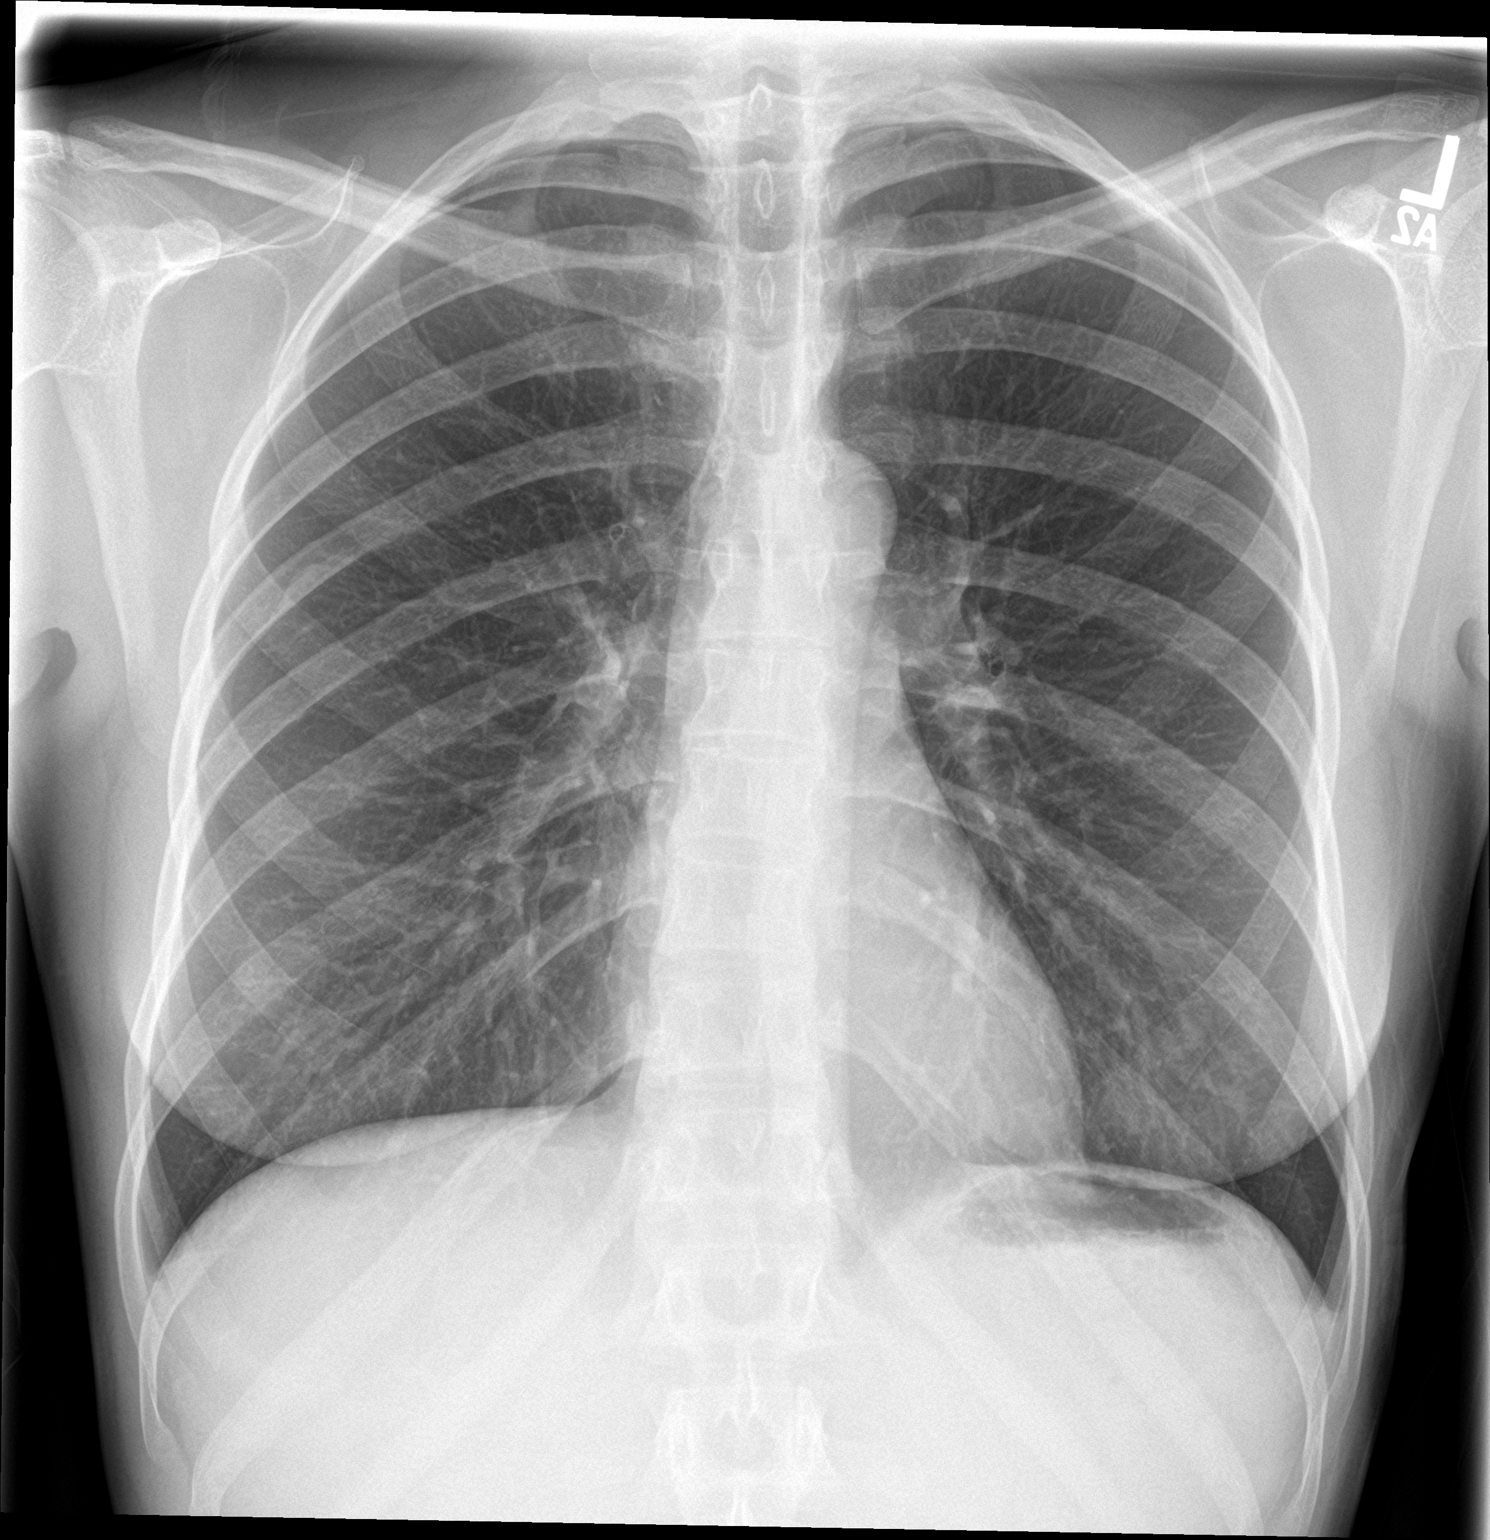

[chest lat]
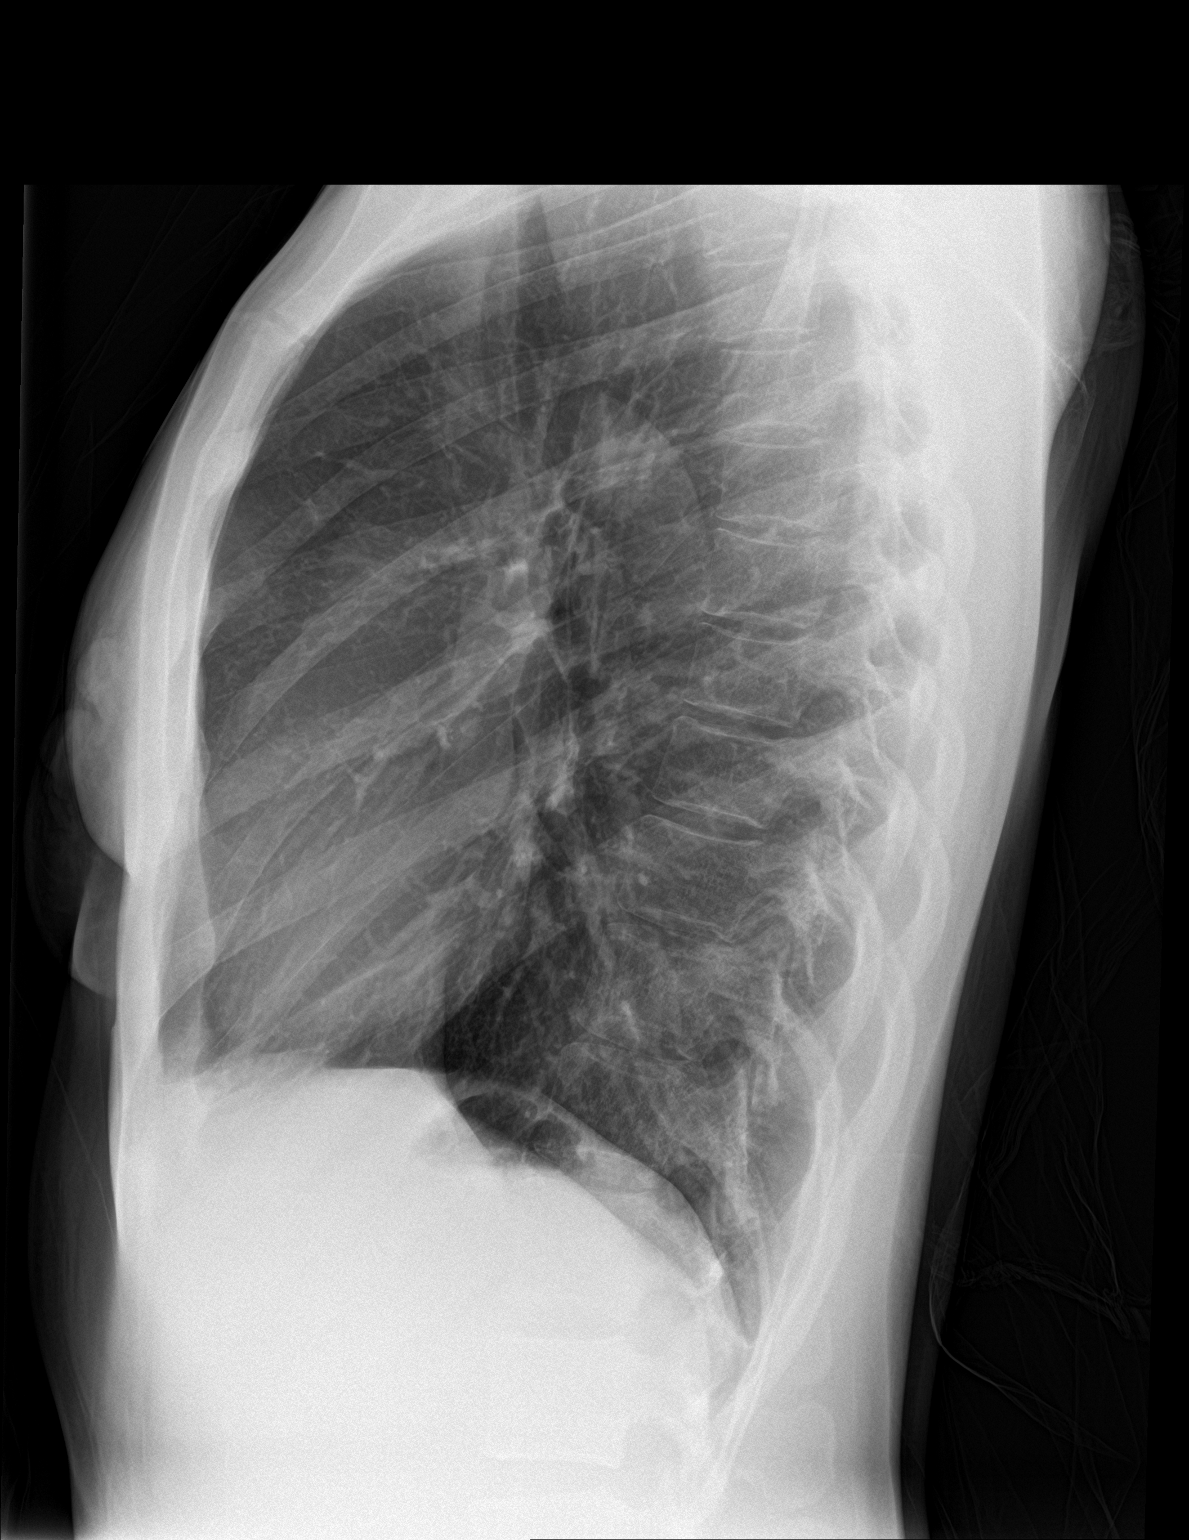

[2 of 2 positions shown; findings below may reference images not displayed]

FINDINGS: Lungs are clear. Heart size and pulmonary vascularity are normal. No
adenopathy. No pneumothorax. No bone lesions.
IMPRESSION: No edema or consolidation.

## 2016-05-06 MED ORDER — IBUPROFEN 800 MG PO TABS: 800.0000 mg | ORAL_TABLET | Freq: Three times a day (TID) | ORAL | 0 refills | Status: DC | PRN

## 2016-05-06 MED ORDER — KETOROLAC TROMETHAMINE 60 MG/2ML IM SOLN
30.0000 mg | Freq: Once | INTRAMUSCULAR | Status: AC
Start: 2016-05-06 — End: 2016-05-06
  Administered 2016-05-06: 30 mg via INTRAMUSCULAR
  Filled 2016-05-06: qty 2

## 2016-05-06 NOTE — ED Provider Notes (Signed)
[facility]-EMERGENCY DEPARTMENT Provider Note   CSN: [id number] Arrival date & time: 05/06/16  1746     History   Chief Complaint Chief Complaint  Patient presents with  . Chest Pain    HPI Christina Roth is a 24 y.o. female presents to the emergency department for evaluation of chest pain. Patient states she developed nontraumatic chest pain 6 days ago. She describes the pain as sharp lasting a few seconds only with taking a deep breath. Last night and today pain increased and she began having or pain with movement such as lifting with her arms. She currently complains of pain only with taking a deep breath, the pain as sharp lasting a few seconds and located along the left pectoralis region above the breast. She has no pain with movement. She denies any increase in activity trauma or injury. Chest no pain with rest. She's had some improvement with ibuprofen. She denies feeling fatigued, weak, nausea or vomiting. No history of DVT. No coughing, shortness of breath. She has not had any recent viral illnesses. She has been afebrile.    HPI  Past Medical History:  Diagnosis Date  . Anemia   . Depression   . Dermatillomania   . Herpes   . HPV (human papilloma virus) infection   . HPV test positive     Patient Active Problem List   Diagnosis Date Noted  . Labor and delivery, indication for care 04/23/2015  . Gestational hypertension without significant proteinuria, postpartum 04/23/2015    Past Surgical History:  Procedure Laterality Date  . NO PAST SURGERIES      OB History    Gravida Para Term Preterm AB Living   4 4 1 3   4    SAB TAB Ectopic Multiple Live Births         0 4       Home Medications    Prior to Admission medications   Medication Sig Start Date End Date Taking? Authorizing Provider  acetaminophen (TYLENOL) 500 MG tablet Take 500 mg by mouth every 6 (six) hours as needed for headache.    Historical Provider, MD  ibuprofen (ADVIL,MOTRIN) 800 MG tablet  Take 1 tablet (800 mg total) by mouth every 8 (eight) hours as needed. 05/06/16   Evon Slack, PA-C  oxyCODONE (OXY IR/ROXICODONE) 5 MG immediate release tablet Take 1 tablet (5 mg total) by mouth every 6 (six) hours as needed for severe pain. 04/25/15   Marta Antu, CNM  Prenatal Vit-Fe Fumarate-FA (PRENATAL MULTIVITAMIN) TABS tablet Take 1 tablet by mouth daily at 12 noon.    Historical Provider, MD    Family History History reviewed. No pertinent family history.  Social History Social History  Substance Use Topics  . Smoking status: Former Smoker    Quit date: 04/22/2013  . Smokeless tobacco: Never Used  . Alcohol use No     Allergies   Review of patient's allergies indicates no known allergies.   Review of Systems Review of Systems  Constitutional: Negative for activity change, chills, fatigue and fever.  HENT: Negative for congestion, sinus pressure and sore throat.   Eyes: Negative for visual disturbance.  Respiratory: Negative for cough, chest tightness and shortness of breath.   Cardiovascular: Positive for chest pain. Negative for leg swelling.  Gastrointestinal: Negative for abdominal pain, diarrhea, nausea and vomiting.  Genitourinary: Negative for dysuria.  Musculoskeletal: Negative for arthralgias and gait problem.  Skin: Negative for rash.  Neurological: Negative for weakness, numbness and headaches.  Hematological: Negative for adenopathy.  Psychiatric/Behavioral: Negative for agitation, behavioral problems and confusion.     Physical Exam Updated Vital Signs BP 114/76 (BP Location: Left Arm)   Pulse 88   Temp 98.6 F (37 C) (Oral)   Resp 18   Ht 5\' 9"  (1.753 m)   Wt 58.5 kg   LMP 05/04/2016   SpO2 100%   BMI 19.05 kg/m   Physical Exam  Constitutional: She appears well-developed and well-nourished. No distress.  HENT:  Head: Normocephalic and atraumatic.  Right Ear: External ear normal.  Left Ear: External ear normal.  Nose: Nose normal.   Eyes: Conjunctivae are normal.  Neck: Normal range of motion. Neck supple.  Cardiovascular: Normal rate, regular rhythm, normal heart sounds and intact distal pulses.  Exam reveals no gallop and no friction rub.   No murmur heard. Pulmonary/Chest: Effort normal and breath sounds normal. No respiratory distress. She exhibits tenderness (mild chest tenderness to palpation along theleft sternal region.).  Abdominal: Soft. There is no tenderness. There is no guarding.  Musculoskeletal: She exhibits no edema.  Neurological: She is alert.  Skin: Skin is warm and dry.  Psychiatric: She has a normal mood and affect.  Nursing note and vitals reviewed.    ED Treatments / Results  Labs (all labs ordered are listed, but only abnormal results are displayed) Labs Reviewed  CBC - Abnormal; Notable for the following:       Result Value   RDW 14.9 (*)    All other components within normal limits  BASIC METABOLIC PANEL  TROPONIN I  FIBRIN DERIVATIVES D-DIMER [facility] ONLY)    EKG  EKG Interpretation None       Radiology Dg Chest 2 View  Result Date: 05/06/2016 CLINICAL DATA:  Chest pain for 3 days. EXAM: CHEST  2 VIEW COMPARISON:  None. FINDINGS: Lungs are clear. Heart size and pulmonary vascularity are normal. No adenopathy. No pneumothorax. No bone lesions. IMPRESSION: No edema or consolidation. Electronically Signed   By: Bretta BangWilliam  Woodruff III M.D.   On: 05/06/2016 18:58    Procedures Procedures (including critical care time)  Medications Ordered in ED Medications  ketorolac (TORADOL) injection 30 mg (30 mg Intramuscular Given 05/06/16 2008)     Initial Impression / Assessment and Plan / ED Course  I have reviewed the triage vital signs and the nursing notes.  Pertinent labs & imaging results that were available during my care of the patient were reviewed by me and considered in my medical decision making (see chart for details).  Clinical Course   25 year old female with  chest pain. Chest pain as sharp lasting only a few seconds and present with taking a deep breath. There is no cough and chest pain is reproduced with palpation. Patient was given Toradol 30 mg IM 1. She had complete resolution of her chest tenderness, pain with taking a deep breath. EKG, d-dimer, troponin, chest x-ray all normal. She is educated on chest wall pain, costochondritis. We'll continue with anti-inflammatory medications and return to the ER for any worsening symptoms or urgent changes in her health.   Final Clinical Impressions(s) / ED Diagnoses   Final diagnoses:  Chest wall pain  Costochondritis, acute    New Prescriptions New Prescriptions   IBUPROFEN (ADVIL,MOTRIN) 800 MG TABLET    Take 1 tablet (800 mg total) by mouth every 8 (eight) hours as needed.     Evon Slackhomas C Ileigh Mettler, PA-C 05/06/16 2034    Nita Sicklearolina Veronese, MD 05/07/16 [phone]

## 2016-05-06 NOTE — ED Triage Notes (Addendum)
States chest pain for 1 week, states pain worsens with deep breathes, states last night pain was worse when sleeping on that side, pt awake and alert in no acute distress, states she recently restarted her birth control

## 2016-05-06 NOTE — Discharge Instructions (Signed)
Return to the ER for any shortness of breath, coughing, increased chest pain worsening symptoms or urgent changes in her health. Avoid strenuous activity take anti-inflammatory medications as prescribed.

## 2017-01-07 ENCOUNTER — Emergency Department
Admission: EM | Admit: 2017-01-07 | Discharge: 2017-01-07 | Disposition: A | Discharge status: Home / Self Care | Payer: Medicaid Other | Attending: Emergency Medicine | Admitting: Emergency Medicine

## 2017-01-07 ENCOUNTER — Emergency Department: Payer: Medicaid Other

## 2017-01-07 DIAGNOSIS — Z3202 Encounter for pregnancy test, result negative: Secondary | ICD-10-CM | POA: Insufficient documentation

## 2017-01-07 DIAGNOSIS — Z87891 Personal history of nicotine dependence: Secondary | ICD-10-CM | POA: Insufficient documentation

## 2017-01-07 DIAGNOSIS — F329 Major depressive disorder, single episode, unspecified: Secondary | ICD-10-CM | POA: Insufficient documentation

## 2017-01-07 DIAGNOSIS — R091 Pleurisy: Secondary | ICD-10-CM

## 2017-01-07 DIAGNOSIS — Z79899 Other long term (current) drug therapy: Secondary | ICD-10-CM | POA: Insufficient documentation

## 2017-01-07 LAB — CBC
HCT: 37.4 % (ref 35.0–47.0)
Hemoglobin: 12.8 g/dL (ref 12.0–16.0)
MCH: 28.8 pg (ref 26.0–34.0)
MCHC: 34.2 g/dL (ref 32.0–36.0)
MCV: 84.4 fL (ref 80.0–100.0)
PLATELETS: 366 10*3/uL (ref 150–440)
RBC: 4.44 MIL/uL (ref 3.80–5.20)
RDW: 13.9 % (ref 11.5–14.5)
WBC: 7.7 10*3/uL (ref 3.6–11.0)

## 2017-01-07 LAB — BASIC METABOLIC PANEL
Anion gap: 7 (ref 5–15)
BUN: 13 mg/dL (ref 6–20)
CALCIUM: 8.9 mg/dL (ref 8.9–10.3)
CO2: 23 mmol/L (ref 22–32)
CREATININE: 0.81 mg/dL (ref 0.44–1.00)
Chloride: 104 mmol/L (ref 101–111)
GFR calc non Af Amer: >60 mL/min (ref >60)
GLUCOSE: 154 mg/dL — AB (ref 65–99)
Potassium: 3.4 mmol/L — ABNORMAL LOW (ref 3.5–5.1)
Sodium: 134 mmol/L — ABNORMAL LOW (ref 135–145)

## 2017-01-07 LAB — TROPONIN I

## 2017-01-07 LAB — HCG, QUANTITATIVE, PREGNANCY: hCG, Beta Chain, Quant, S: <1 m[IU]/mL (ref <5)

## 2017-01-07 IMAGING — CR DG CHEST 2V
1 series · 2 of 2 positions shown · non-contrast
Comparison: [DATE]

CLINICAL DATA: Chest pain, no shortness of breath

EXAM:
CHEST  2 VIEW

[Series 1: w chest pa · 0.14mm/px · 2 of 2 slices shown]
[im 1/2]
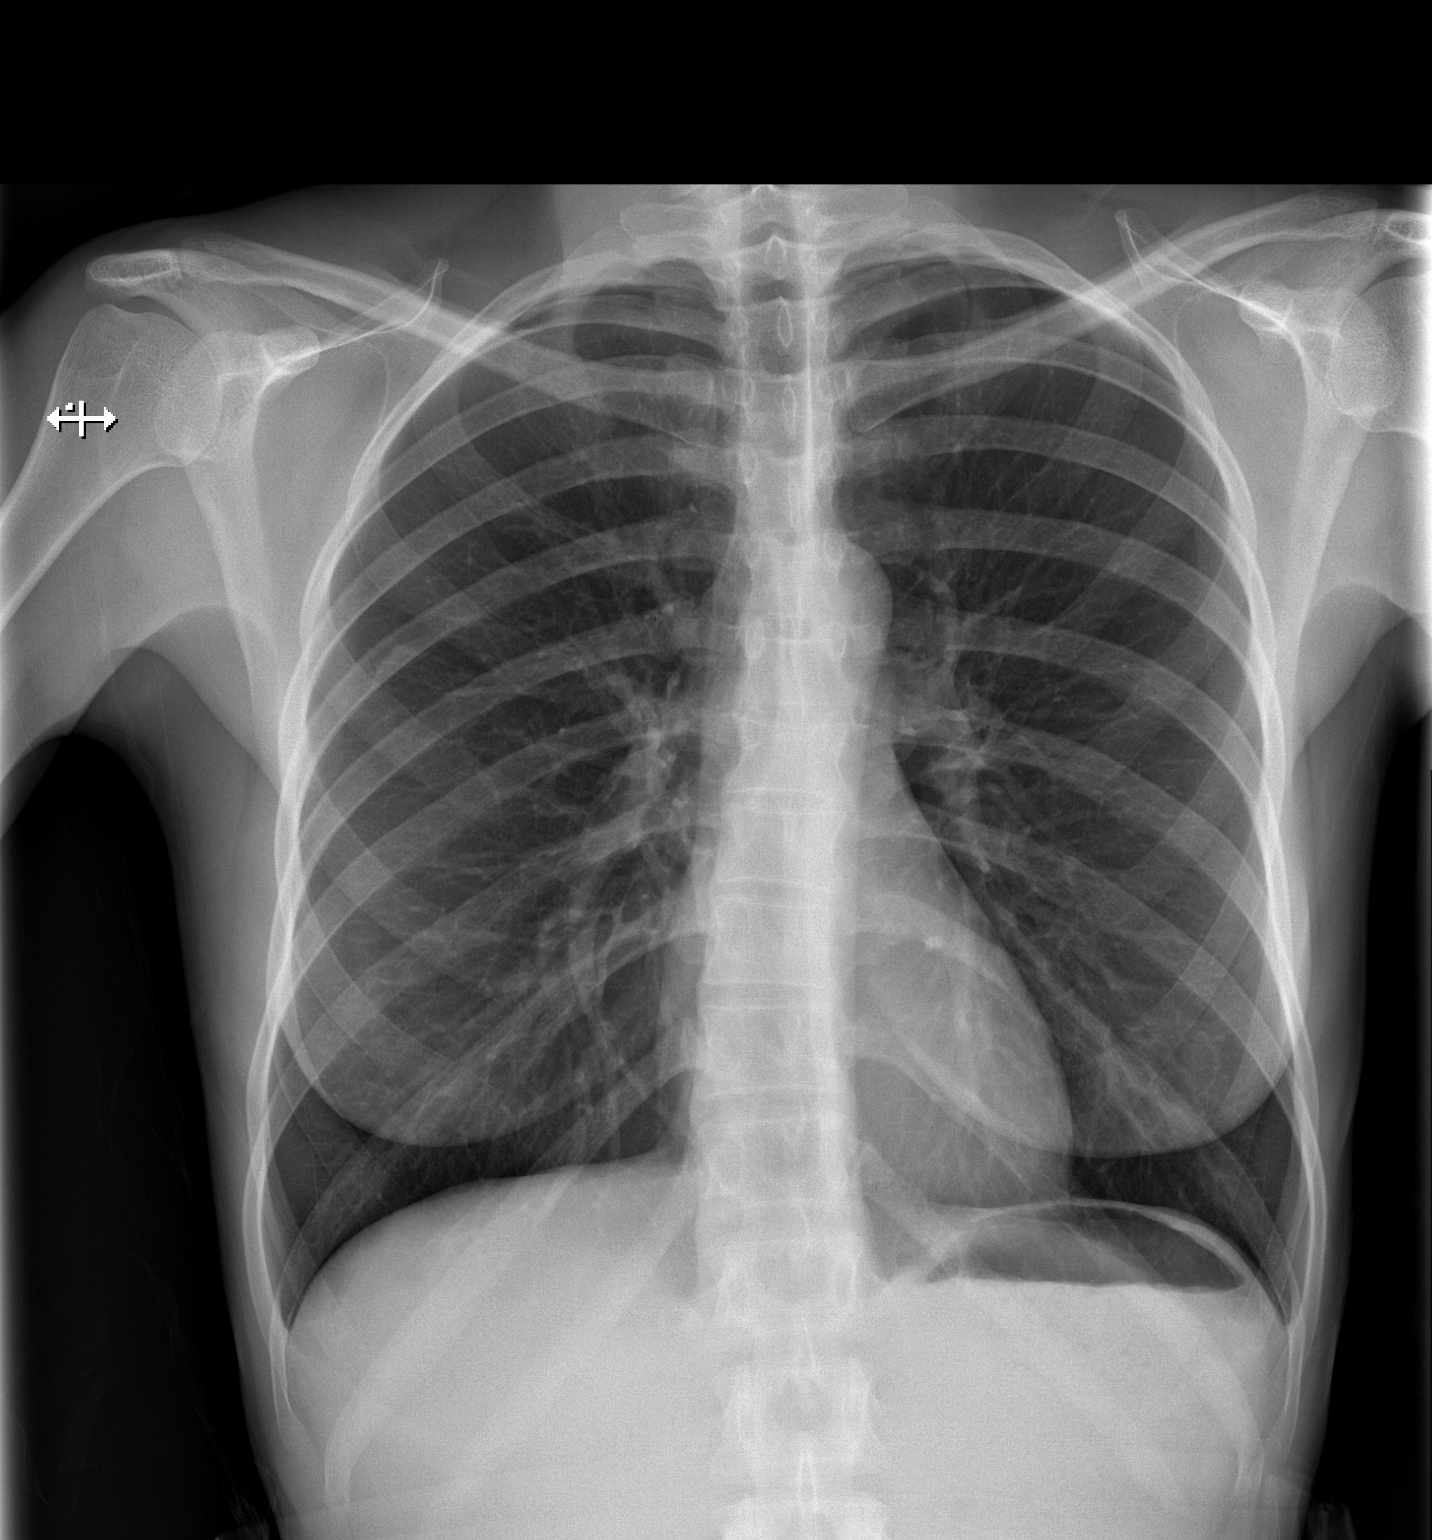
[im 2/2]
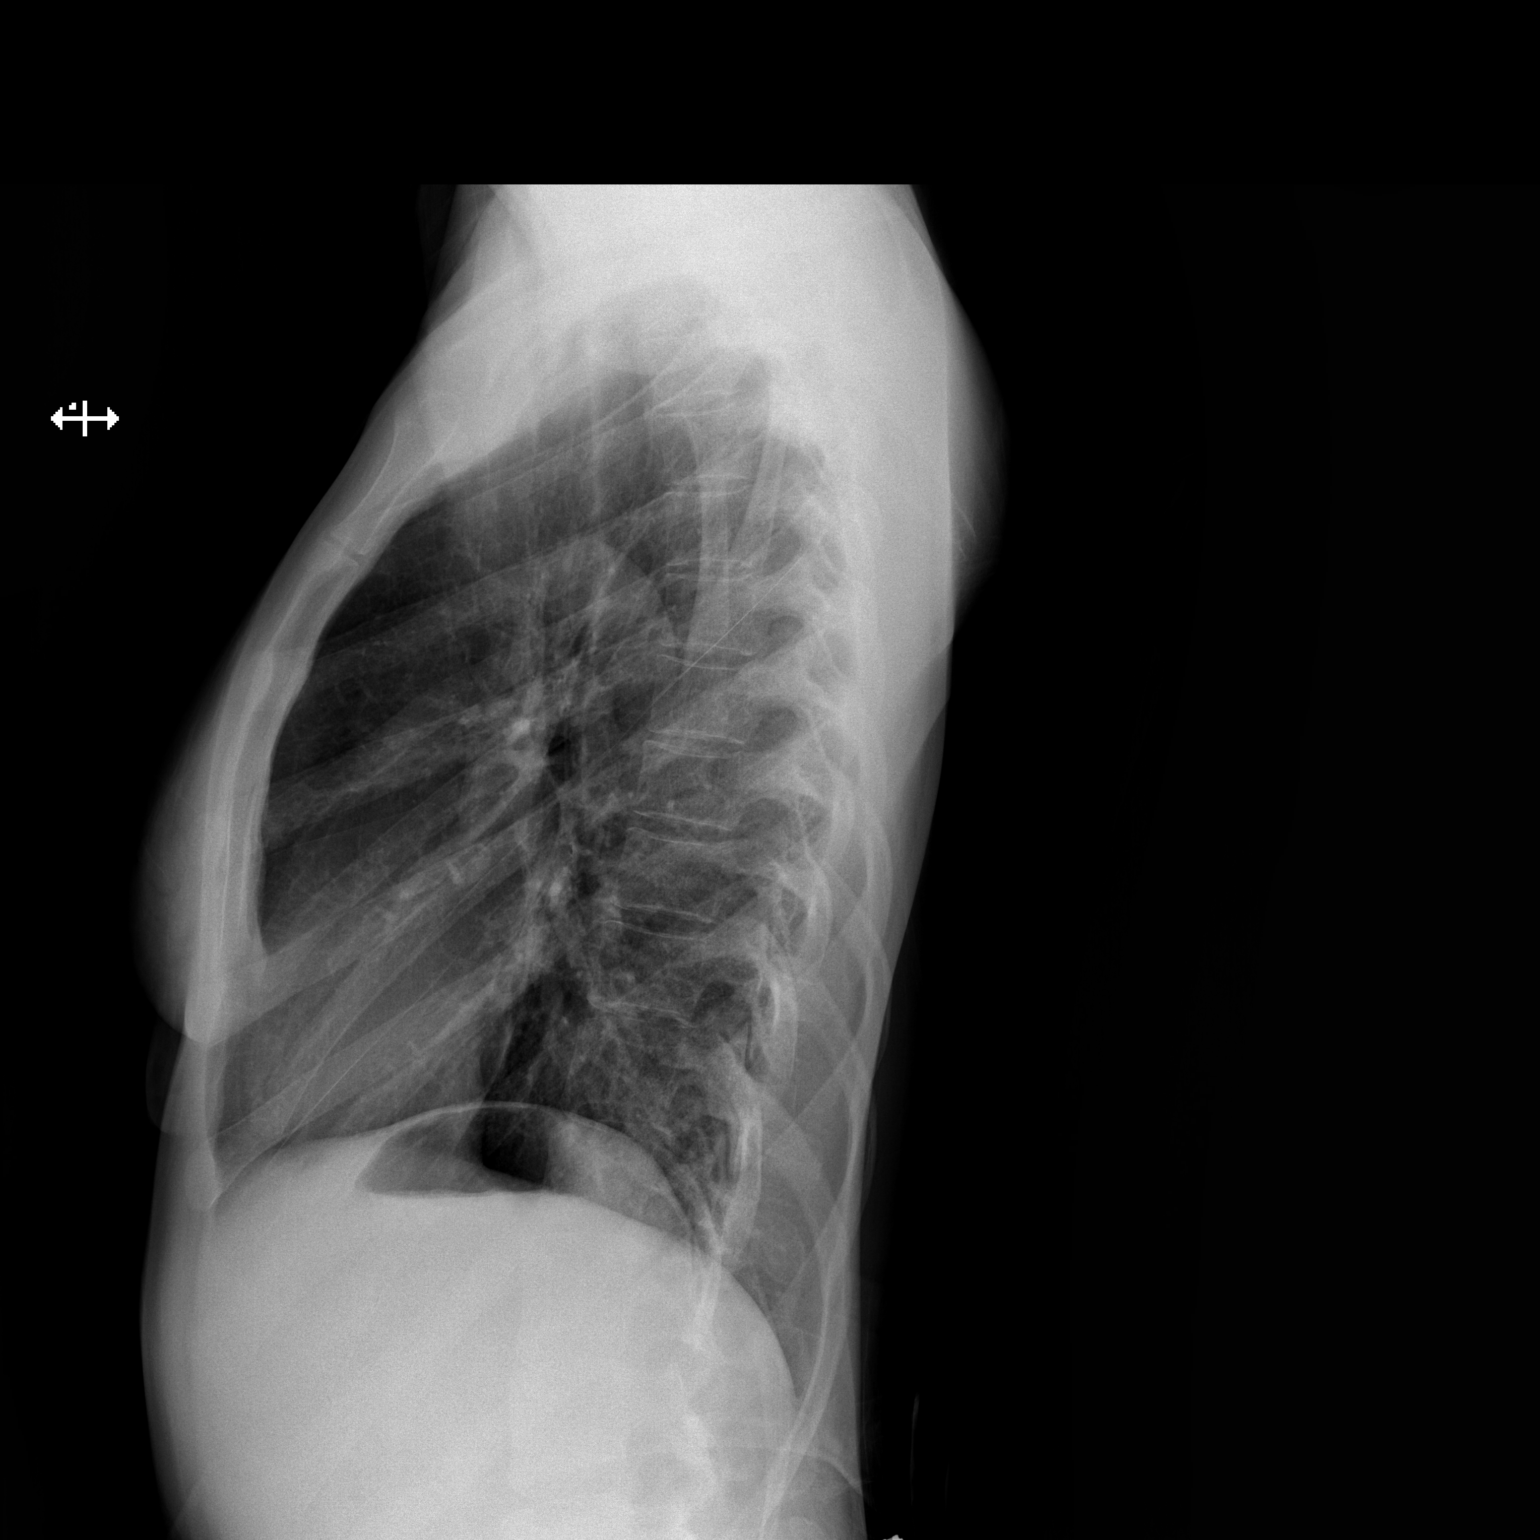

[2 of 2 positions shown; findings below may reference images not displayed]

FINDINGS: The heart size and mediastinal contours are within normal limits.
Both lungs are clear. The visualized skeletal structures are
unremarkable.
IMPRESSION: No active cardiopulmonary disease.

## 2017-01-07 MED ORDER — IBUPROFEN 400 MG PO TABS
600.0000 mg | ORAL_TABLET | Freq: Once | ORAL | Status: AC
Start: 2017-01-07 — End: 2017-01-07
  Administered 2017-01-07: 600 mg via ORAL
  Filled 2017-01-07: qty 2

## 2017-01-07 MED ORDER — IBUPROFEN 600 MG PO TABS: 600.0000 mg | ORAL_TABLET | Freq: Three times a day (TID) | ORAL | 0 refills | Status: DC | PRN

## 2017-01-07 NOTE — Discharge Instructions (Signed)
Please take 600 mg of ibuprofen 3 times a day for the next week for your chest pain. Return to the emergency department for any new or worsening symptoms such as worsening pain, shortness of breath, or for any other concerns.  It was a pleasure to take care of you today, and thank you for coming to our emergency department.  If you have any questions or concerns before leaving please ask the nurse to grab me and I'm more than happy to go through your aftercare instructions again.  If you were prescribed any opioid pain medication today such as Norco, Vicodin, Percocet, morphine, hydrocodone, or oxycodone please make sure you do not drive when you are taking this medication as it can alter your ability to drive safely.  If you have any concerns once you are home that you are not improving or are in fact getting worse before you can make it to your follow-up appointment, please do not hesitate to call [phone] and come back for further evaluation.  Merrily BrittleNeil Adisa Vigeant MD  Results for orders placed or performed during the hospital encounter of 01/07/17  hCG, quantitative, pregnancy  Result Value Ref Range   hCG, Beta Chain, Quant, S <1 <5 mIU/mL  Basic metabolic panel  Result Value Ref Range   Sodium 134 (L) 135 - 145 mmol/L   Potassium 3.4 (L) 3.5 - 5.1 mmol/L   Chloride 104 101 - 111 mmol/L   CO2 23 22 - 32 mmol/L   Glucose, Bld 154 (H) 65 - 99 mg/dL   BUN 13 6 - 20 mg/dL   Creatinine, Ser 9.600.81 0.44 - 1.00 mg/dL   Calcium 8.9 8.9 - 45.410.3 mg/dL   GFR calc non Af Amer >60 >60 mL/min   GFR calc Af Amer >60 >60 mL/min   Anion gap 7 5 - 15  CBC  Result Value Ref Range   WBC 7.7 3.6 - 11.0 K/uL   RBC 4.44 3.80 - 5.20 MIL/uL   Hemoglobin 12.8 12.0 - 16.0 g/dL   HCT 09.837.4 11.935.0 - 14.747.0 %   MCV 84.4 80.0 - 100.0 fL   MCH 28.8 26.0 - 34.0 pg   MCHC 34.2 32.0 - 36.0 g/dL   RDW 82.913.9 56.211.5 - 13.014.5 %   Platelets 366 150 - 440 K/uL  Troponin I  Result Value Ref Range   Troponin I <0.03 <0.03 ng/mL   Dg  Chest 2 View  Result Date: 01/07/2017 CLINICAL DATA:  Chest pain, no shortness of breath EXAM: CHEST  2 VIEW COMPARISON:  05/06/2016 FINDINGS: The heart size and mediastinal contours are within normal limits. Both lungs are clear. The visualized skeletal structures are unremarkable. IMPRESSION: No active cardiopulmonary disease. Electronically Signed   By: Elige KoHetal  Patel   On: 01/07/2017 14:18

## 2017-01-07 NOTE — ED Triage Notes (Signed)
Pt states central CP that began yesterday at work. States SOB as well. States she has had multiple positive pregnancy tests but then had a negative. Last period beginning of may, started spotting last night. Pt was at doctor and told her to come here. States this AM had a "gush of blood" going through 1 pad per hour.

## 2017-01-07 NOTE — ED Notes (Signed)
Pt has 4 current children, all living.

## 2017-01-07 NOTE — ED Provider Notes (Signed)
[facility] Emergency Department Provider Note  ____________________________________________   First MD Initiated Contact with Patient 01/07/17 1534     (approximate)  I have reviewed the triage vital signs and the nursing notes.   HISTORY  Chief Complaint Chest Pain and Vaginal Bleeding    HPI Christina Roth is a 26 y.o. female who comes to the emergency department requesting a pregnancy test. She currently has 4 children and took 3 pregnancy tests at home from the same box and they were all positive. Today she went to her primary care physician who performed a urine pregnancy test which was negative but she was advised to come to the emergency department for a blood test to confirm. She also reports incidentally that yesterday she began having intermittent mild to moderate sharp pleuritic chest pain. Worse with deep inspiration and improved when not breathing. Nonexertional. No leg swelling. No history of DVT or PE. She is not on birth control. She's had no recent surgery or immobilization.   Past Medical History:  Diagnosis Date  . Anemia   . Depression   . Dermatillomania   . Herpes   . HPV (human papilloma virus) infection   . HPV test positive     Patient Active Problem List   Diagnosis Date Noted  . Labor and delivery, indication for care 04/23/2015  . Gestational hypertension without significant proteinuria, postpartum 04/23/2015    Past Surgical History:  Procedure Laterality Date  . NO PAST SURGERIES      Prior to Admission medications   Medication Sig Start Date End Date Taking? Authorizing Provider  acetaminophen (TYLENOL) 500 MG tablet Take 500 mg by mouth every 6 (six) hours as needed for headache.    [provider]  ibuprofen (ADVIL,MOTRIN) 600 MG tablet Take 1 tablet (600 mg total) by mouth every 8 (eight) hours as needed. 01/07/17   Merrily Brittle, MD  oxyCODONE (OXY IR/ROXICODONE) 5 MG immediate release tablet Take  1 tablet (5 mg total) by mouth every 6 (six) hours as needed for severe pain. 04/25/15   Marta Antu, CNM  Prenatal Vit-Fe Fumarate-FA (PRENATAL MULTIVITAMIN) TABS tablet Take 1 tablet by mouth daily at 12 noon.    [provider]    Allergies Patient has no known allergies.  History reviewed. No pertinent family history.  Social History Social History  Substance Use Topics  . Smoking status: Former Smoker    Quit date: 04/22/2013  . Smokeless tobacco: Never Used  . Alcohol use No    Review of Systems Constitutional: No fever/chills ENT: No sore throat. Cardiovascular: Positive chest pain. Respiratory: Denies shortness of breath. Gastrointestinal: No abdominal pain.  No nausea, no vomiting.  No diarrhea.  No constipation. Musculoskeletal: Negative for back pain. Neurological: Negative for headaches   ____________________________________________   PHYSICAL EXAM:  VITAL SIGNS: ED Triage Vitals  Enc Vitals Group     BP 01/07/17 1346 106/65     Pulse Rate 01/07/17 1346 74     Resp 01/07/17 1346 20     Temp 01/07/17 1346 98.2 F (36.8 C)     Temp Source 01/07/17 1346 Oral     SpO2 01/07/17 1346 96 %     Weight 01/07/17 1342 120 lb (54.4 kg)     Height 01/07/17 1342 5\' 6"  (1.676 m)     Head Circumference --      Peak Flow --      Pain Score 01/07/17 1342 6  Pain Loc --      Pain Edu? --      Excl. in GC? --     Constitutional: Alert and oriented 4 well appearing nontoxic no diaphoresis speaks in full clear sentences Head: Atraumatic. Nose: No congestion/rhinnorhea. Mouth/Throat: No trismus Neck: No stridor.   Cardiovascular: Regular rate and rhythm no murmurs Respiratory: Normal respiratory effort.  No retractions. Chest wall nontender Gastrointestinal: Soft nontender Neurologic:  Normal speech and language. No gross focal neurologic deficits are appreciated.  Skin:  Skin is warm, dry and intact. No rash  noted.    ____________________________________________  LABS (all labs ordered are listed, but only abnormal results are displayed)  Labs Reviewed  BASIC METABOLIC PANEL - Abnormal; Notable for the following:       Result Value   Sodium 134 (*)    Potassium 3.4 (*)    Glucose, Bld 154 (*)    All other components within normal limits  HCG, QUANTITATIVE, PREGNANCY  CBC  TROPONIN I    No signs of acute ischemia and not pertinent __________________________________________  EKG  ED ECG REPORT I, Merrily BrittleNeil Eyvonne Burchfield, the attending physician, personally viewed and interpreted this ECG.  Date: 01/07/2017 Rate: 72 Rhythm: normal sinus rhythm QRS Axis: normal Intervals: normal ST/T Wave abnormalities: normal Narrative Interpretation: unremarkable  ____________________________________________  RADIOLOGY  Chest x-ray with no acute disease ____________________________________________   PROCEDURES  Procedure(s) performed: no  Procedures  Critical Care performed: no  Observation: no ____________________________________________   INITIAL IMPRESSION / ASSESSMENT AND PLAN / ED COURSE  Pertinent labs & imaging results that were available during my care of the patient were reviewed by me and considered in my medical decision making (see chart for details).  The patient is primarily here requesting a pregnancy test. It is negative and she is significantly relieved. She also clearly has pleuritic chest pain. It is not positional and it does not seem consistent with pericarditis. Doubt pulmonary embolism and she is PERC negative.  At this point she is medically stable for outpatient management with a trial of NSAIDs.      ____________________________________________   FINAL CLINICAL IMPRESSION(S) / ED DIAGNOSES  Final diagnoses:  Pleurisy      NEW MEDICATIONS STARTED DURING THIS VISIT:  New Prescriptions   IBUPROFEN (ADVIL,MOTRIN) 600 MG TABLET    Take 1 tablet  (600 mg total) by mouth every 8 (eight) hours as needed.     Note:  This document was prepared using Dragon voice recognition software and may include unintentional dictation errors.      Merrily Brittleifenbark, Martyna Thorns, MD 01/07/17 [phone]

## 2020-05-14 ENCOUNTER — Emergency Department: Payer: BC Managed Care – PPO

## 2020-05-14 ENCOUNTER — Other Ambulatory Visit: Payer: Self-pay

## 2020-05-14 ENCOUNTER — Encounter: Payer: Self-pay | Admitting: *Deleted

## 2020-05-14 ENCOUNTER — Emergency Department
Admission: EM | Admit: 2020-05-14 | Discharge: 2020-05-14 | Disposition: A | Discharge status: Home / Self Care | Payer: BC Managed Care – PPO | Attending: Student in an Organized Health Care Education/Training Program | Admitting: Student in an Organized Health Care Education/Training Program

## 2020-05-14 DIAGNOSIS — S199XXA Unspecified injury of neck, initial encounter: Secondary | ICD-10-CM | POA: Diagnosis present

## 2020-05-14 DIAGNOSIS — Z87891 Personal history of nicotine dependence: Secondary | ICD-10-CM | POA: Insufficient documentation

## 2020-05-14 DIAGNOSIS — Y92481 Parking lot as the place of occurrence of the external cause: Secondary | ICD-10-CM | POA: Diagnosis not present

## 2020-05-14 DIAGNOSIS — M25512 Pain in left shoulder: Secondary | ICD-10-CM | POA: Insufficient documentation

## 2020-05-14 DIAGNOSIS — M546 Pain in thoracic spine: Secondary | ICD-10-CM | POA: Diagnosis not present

## 2020-05-14 DIAGNOSIS — S161XXA Strain of muscle, fascia and tendon at neck level, initial encounter: Secondary | ICD-10-CM | POA: Insufficient documentation
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
## 2020-05-14 MED ORDER — TRAMADOL HCL 50 MG PO TABS: 50.0000 mg | ORAL_TABLET | Freq: Four times a day (QID) | ORAL | 0 refills | Status: AC | PRN

## 2020-05-14 MED ORDER — TRAMADOL HCL 50 MG PO TABS
50.0000 mg | ORAL_TABLET | Freq: Once | ORAL | Status: AC
  Administered 2020-05-14: 50 mg via ORAL
  Filled 2020-05-14: qty 1

## 2020-05-14 MED ORDER — METHOCARBAMOL 750 MG PO TABS: 750.0000 mg | ORAL_TABLET | Freq: Four times a day (QID) | ORAL | 0 refills | Status: AC | PRN

## 2020-05-14 MED ORDER — KETOROLAC TROMETHAMINE 60 MG/2ML IM SOLN
30.0000 mg | Freq: Once | INTRAMUSCULAR | Status: AC
  Administered 2020-05-14: 30 mg via INTRAMUSCULAR
  Filled 2020-05-14: qty 2

## 2020-05-14 MED ORDER — METHOCARBAMOL 500 MG PO TABS
750.0000 mg | ORAL_TABLET | Freq: Once | ORAL | Status: AC
  Administered 2020-05-14: 750 mg via ORAL
  Filled 2020-05-14: qty 2

## 2020-05-14 NOTE — Discharge Instructions (Signed)
Please follow-up with primary care and return to the emergency department with any worsening.
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
## 2021-11-26 ENCOUNTER — Emergency Department: Payer: BC Managed Care – PPO

## 2021-11-26 ENCOUNTER — Emergency Department
Admission: EM | Admit: 2021-11-26 | Discharge: 2021-11-27 | Disposition: A | Discharge status: Home / Self Care | Payer: BC Managed Care – PPO | Attending: Emergency Medicine | Admitting: Emergency Medicine

## 2021-11-26 DIAGNOSIS — M79605 Pain in left leg: Secondary | ICD-10-CM | POA: Diagnosis not present

## 2021-11-26 DIAGNOSIS — R0602 Shortness of breath: Secondary | ICD-10-CM | POA: Diagnosis present

## 2021-11-26 DIAGNOSIS — R051 Acute cough: Secondary | ICD-10-CM

## 2021-11-26 DIAGNOSIS — Z20822 Contact with and (suspected) exposure to covid-19: Secondary | ICD-10-CM | POA: Diagnosis not present

## 2021-11-26 DIAGNOSIS — R8289 Other abnormal findings on cytological and histological examination of urine: Secondary | ICD-10-CM | POA: Diagnosis not present

## 2021-11-26 DIAGNOSIS — M546 Pain in thoracic spine: Secondary | ICD-10-CM | POA: Insufficient documentation

## 2021-11-26 DIAGNOSIS — D72829 Elevated white blood cell count, unspecified: Secondary | ICD-10-CM | POA: Insufficient documentation

## 2021-11-26 DIAGNOSIS — M549 Dorsalgia, unspecified: Secondary | ICD-10-CM

## 2021-11-26 DIAGNOSIS — J00 Acute nasopharyngitis [common cold]: Secondary | ICD-10-CM | POA: Insufficient documentation

## 2021-11-26 DIAGNOSIS — R509 Fever, unspecified: Secondary | ICD-10-CM

## 2021-11-26 DIAGNOSIS — N6310 Unspecified lump in the right breast, unspecified quadrant: Secondary | ICD-10-CM

## 2021-11-26 LAB — URINALYSIS, ROUTINE W REFLEX MICROSCOPIC
Bilirubin Urine: NEGATIVE
Glucose, UA: NEGATIVE mg/dL
Ketones, ur: NEGATIVE mg/dL
Leukocytes,Ua: NEGATIVE
Nitrite: NEGATIVE
Protein, ur: NEGATIVE mg/dL
Specific Gravity, Urine: 1.011 (ref 1.005–1.030)
pH: 6 (ref 5.0–8.0)

## 2021-11-26 LAB — BASIC METABOLIC PANEL
Anion gap: 8 (ref 5–15)
BUN: 10 mg/dL (ref 6–20)
CO2: 22 mmol/L (ref 22–32)
Calcium: 9 mg/dL (ref 8.9–10.3)
Chloride: 107 mmol/L (ref 98–111)
Creatinine, Ser: 0.73 mg/dL (ref 0.44–1.00)
GFR, Estimated: >60 mL/min (ref >60)
Glucose, Bld: 117 mg/dL — ABNORMAL HIGH (ref 70–99)
Potassium: 3.8 mmol/L (ref 3.5–5.1)
Sodium: 137 mmol/L (ref 135–145)

## 2021-11-26 LAB — CBC WITH DIFFERENTIAL/PLATELET
Abs Immature Granulocytes: 0.04 10*3/uL (ref 0.00–0.07)
Basophils Absolute: 0.1 10*3/uL (ref 0.0–0.1)
Basophils Relative: 0 %
Eosinophils Absolute: 0.3 10*3/uL (ref 0.0–0.5)
Eosinophils Relative: 2 %
HCT: 43 % (ref 36.0–46.0)
Hemoglobin: 13.6 g/dL (ref 12.0–15.0)
Immature Granulocytes: 0 %
Lymphocytes Relative: 6 %
Lymphs Abs: 0.7 10*3/uL (ref 0.7–4.0)
MCH: 27.3 pg (ref 26.0–34.0)
MCHC: 31.6 g/dL (ref 30.0–36.0)
MCV: 86.2 fL (ref 80.0–100.0)
Monocytes Absolute: 0.6 10*3/uL (ref 0.1–1.0)
Monocytes Relative: 5 %
Neutro Abs: 10.8 10*3/uL — ABNORMAL HIGH (ref 1.7–7.7)
Neutrophils Relative %: 87 %
Platelets: 372 10*3/uL (ref 150–400)
RBC: 4.99 MIL/uL (ref 3.87–5.11)
RDW: 13.8 % (ref 11.5–15.5)
WBC: 12.5 10*3/uL — ABNORMAL HIGH (ref 4.0–10.5)
nRBC: 0 % (ref 0.0–0.2)

## 2021-11-26 LAB — HEPATIC FUNCTION PANEL
ALT: 16 U/L (ref 0–44)
AST: 23 U/L (ref 15–41)
Albumin: 4.3 g/dL (ref 3.5–5.0)
Alkaline Phosphatase: 79 U/L (ref 38–126)
Bilirubin, Direct: 0.2 mg/dL (ref 0.0–0.2)
Indirect Bilirubin: 1 mg/dL — ABNORMAL HIGH (ref 0.3–0.9)
Total Bilirubin: 1.2 mg/dL (ref 0.3–1.2)
Total Protein: 7.9 g/dL (ref 6.5–8.1)

## 2021-11-26 LAB — CBC
HCT: 42.4 % (ref 36.0–46.0)
Hemoglobin: 13.7 g/dL (ref 12.0–15.0)
MCH: 27.5 pg (ref 26.0–34.0)
MCHC: 32.3 g/dL (ref 30.0–36.0)
MCV: 85.1 fL (ref 80.0–100.0)
Platelets: 354 10*3/uL (ref 150–400)
RBC: 4.98 MIL/uL (ref 3.87–5.11)
RDW: 13.6 % (ref 11.5–15.5)
WBC: 12.8 10*3/uL — ABNORMAL HIGH (ref 4.0–10.5)
nRBC: 0 % (ref 0.0–0.2)

## 2021-11-26 LAB — RESP PANEL BY RT-PCR (FLU A&B, COVID) ARPGX2
Influenza A by PCR: NEGATIVE
Influenza B by PCR: NEGATIVE
SARS Coronavirus 2 by RT PCR: NEGATIVE

## 2021-11-26 LAB — D-DIMER, QUANTITATIVE: D-Dimer, Quant: 0.3 ug/mL-FEU (ref 0.00–0.50)

## 2021-11-26 LAB — TROPONIN I (HIGH SENSITIVITY)
Troponin I (High Sensitivity): 3 ng/L (ref <18)
Troponin I (High Sensitivity): 2 ng/L (ref <18)

## 2021-11-26 LAB — POC URINE PREG, ED: Preg Test, Ur: NEGATIVE

## 2021-11-26 IMAGING — CR DG CHEST 2V
1 series · 2 of 2 positions shown · non-contrast
Comparison: [DATE].

CLINICAL DATA: Chest pain.

EXAM:
CHEST - 2 VIEW

[Series 1: dg chest 2 view · 0.14mm/px · 2 of 2 slices shown]
[im 1/2]
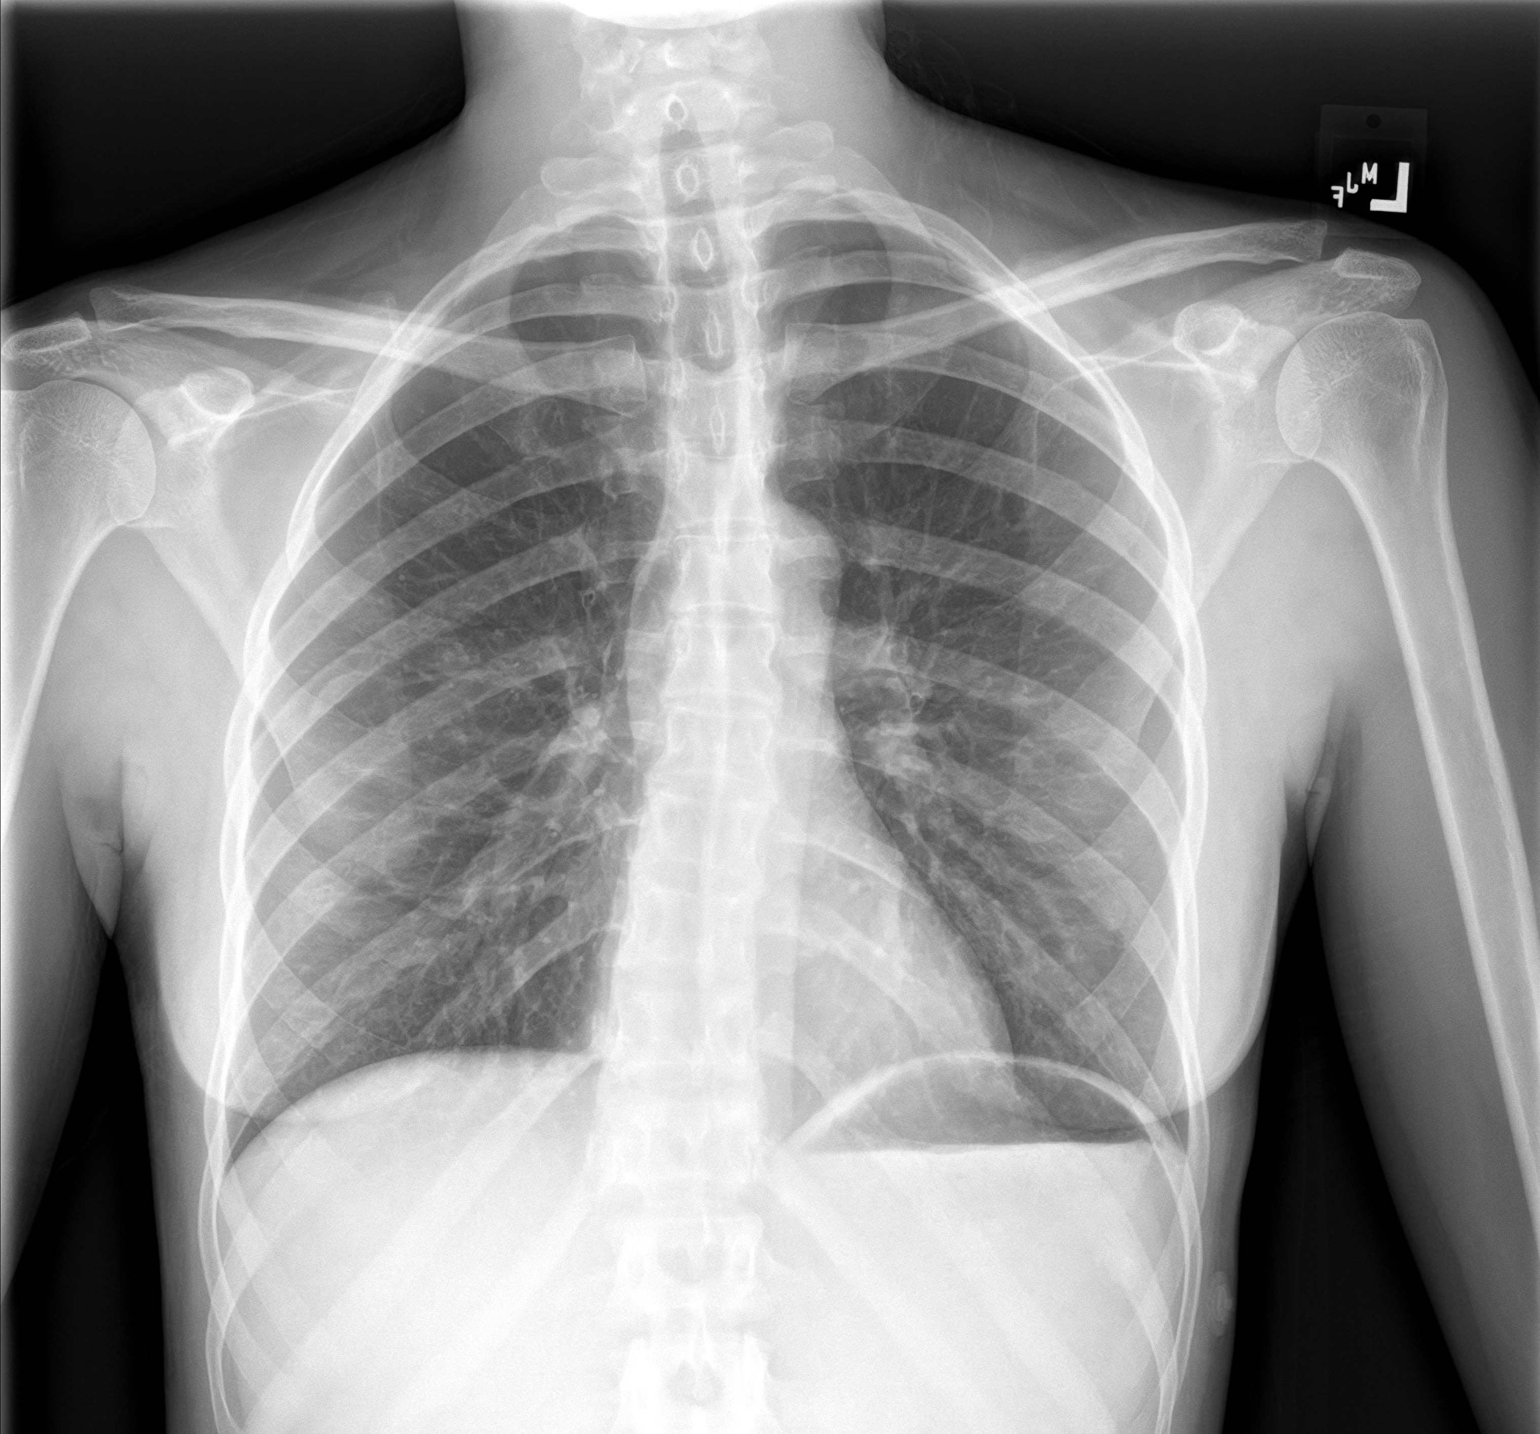
[im 2/2]
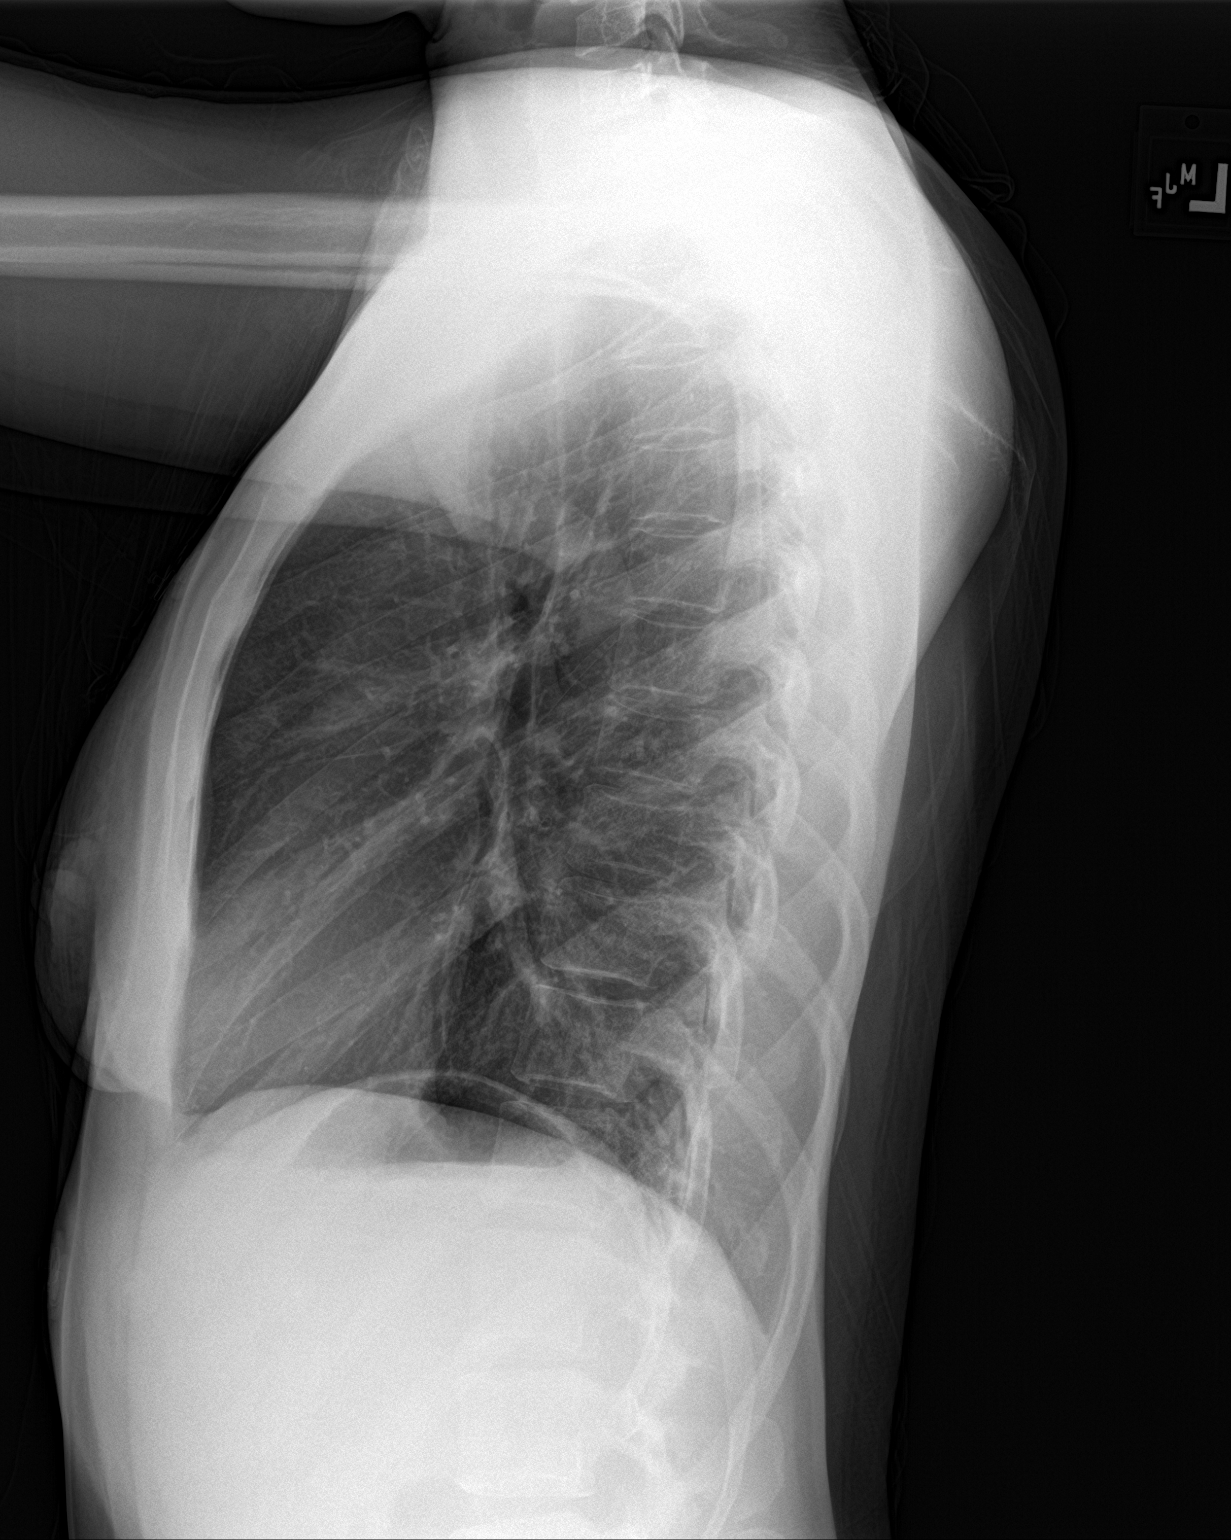

[2 of 2 positions shown; findings below may reference images not displayed]

FINDINGS: The heart size and mediastinal contours are within normal limits.
Both lungs are clear. The visualized skeletal structures are
unremarkable.
IMPRESSION: No active cardiopulmonary disease.

## 2021-11-26 IMAGING — CT CT RENAL STONE PROTOCOL
2 of 4 series · 16 of 46 positions shown, 18 images · non-contrast
Comparison: None Available.

CLINICAL DATA: Chest pain and shortness of breath.



[Series 3: stone full standard · axial · 0.71mm/px · z∈[-160,+246]mm · 13 of 89 slices shown, 15 images]
[im 4/89  soft-tissue]
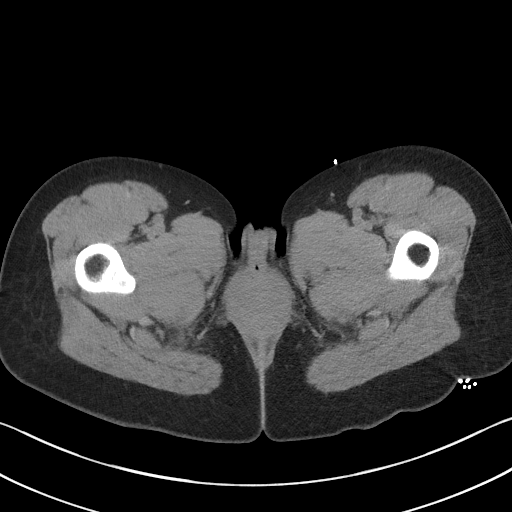
[im 4/89  bone]
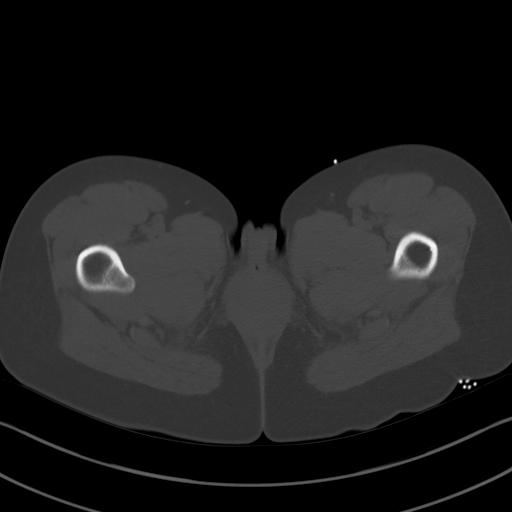
[im 12/89  soft-tissue]
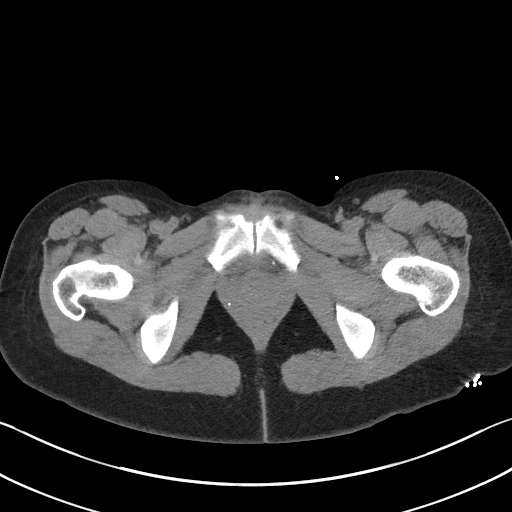
[im 19/89  soft-tissue]
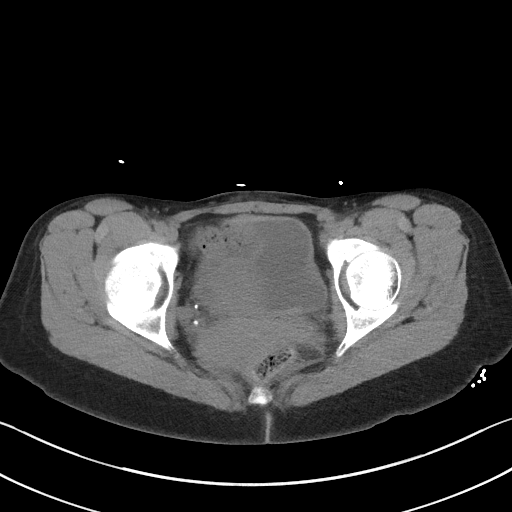
[im 26/89  soft-tissue]
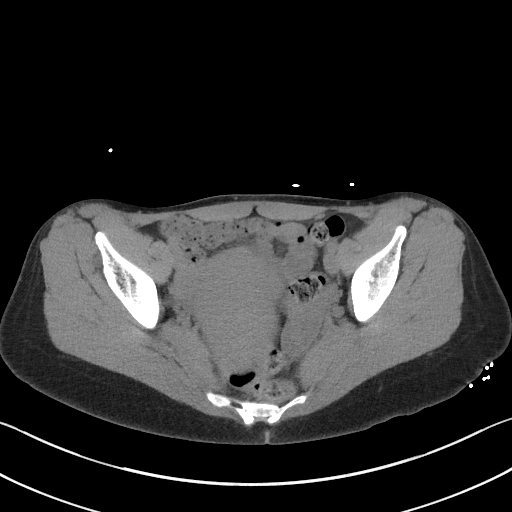
[im 30/89  soft-tissue]
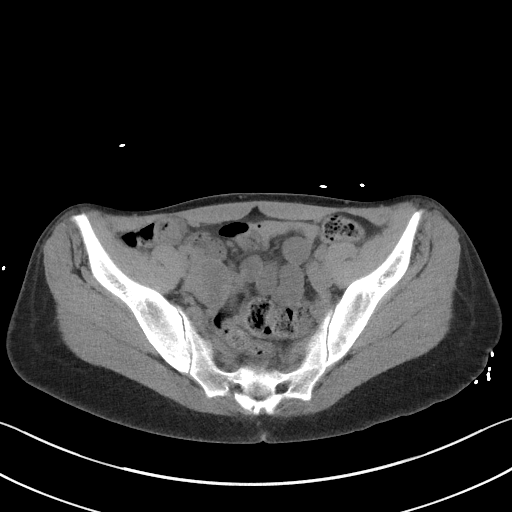
[im 37/89  soft-tissue]
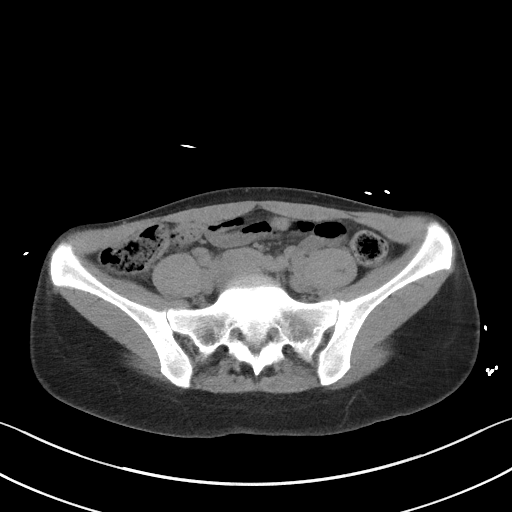
[im 45/89  soft-tissue]
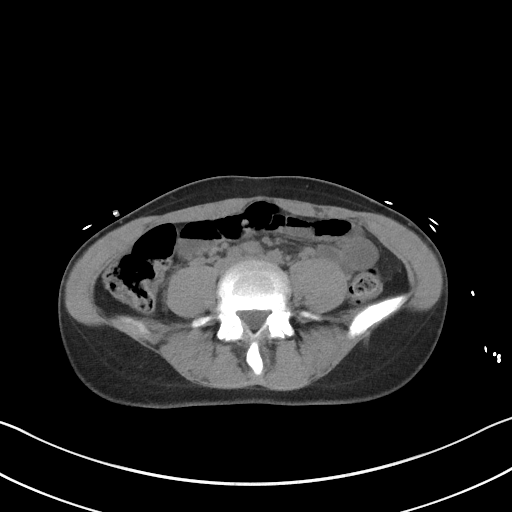
[im 52/89  soft-tissue]
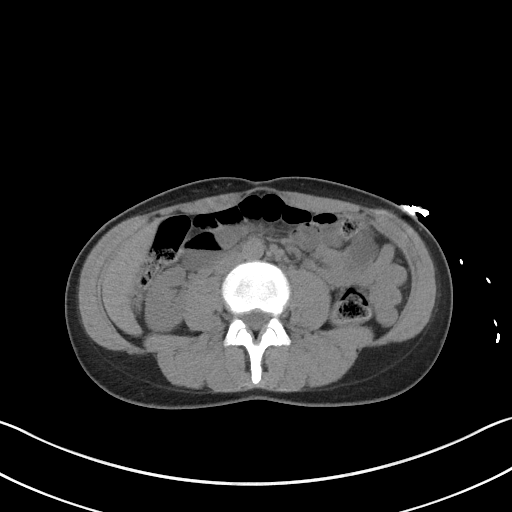
[im 59/89  soft-tissue]
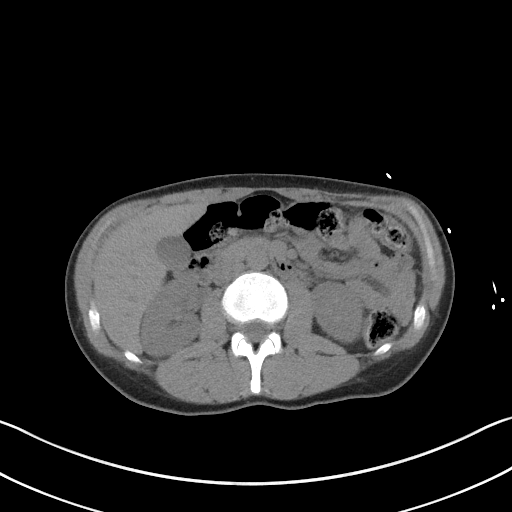
[im 59/89  bone]
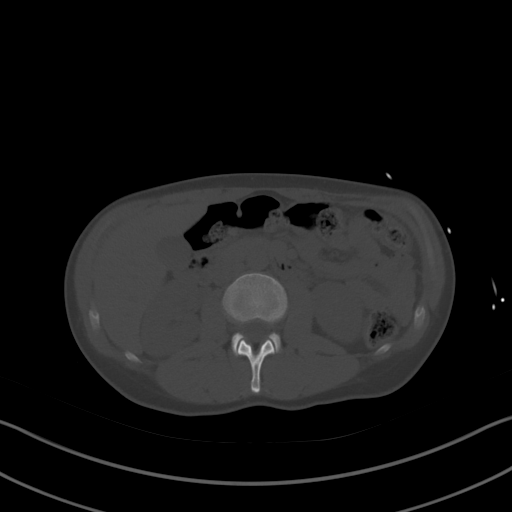
[im 63/89  soft-tissue]
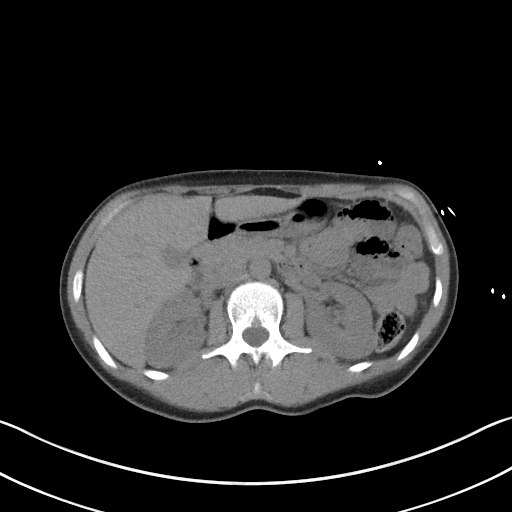
[im 70/89  soft-tissue]
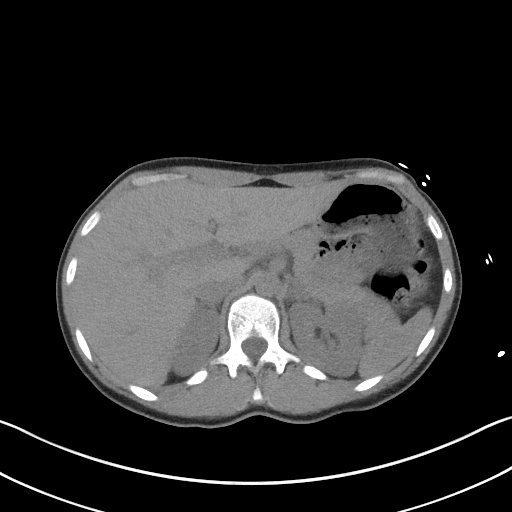
[im 78/89  soft-tissue]
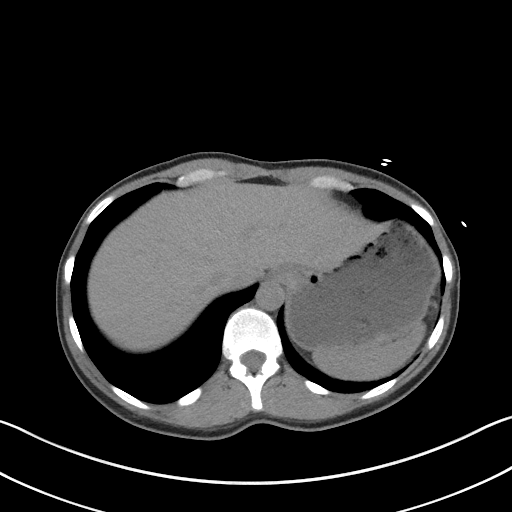
[im 85/89  soft-tissue]
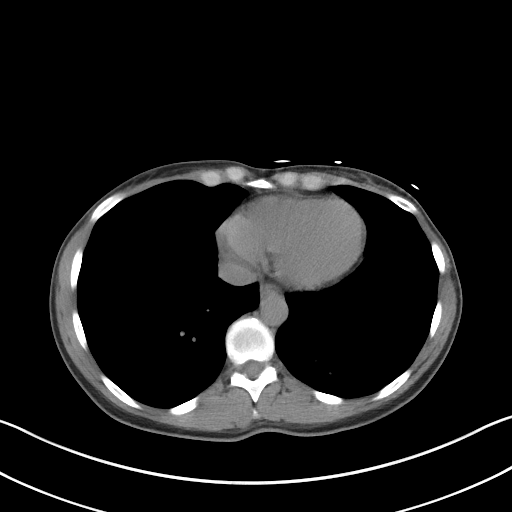

[Series 6: coronal · coronal · 0.75mm/px · 3 of 94 slices shown]
[im 32/94  soft-tissue]
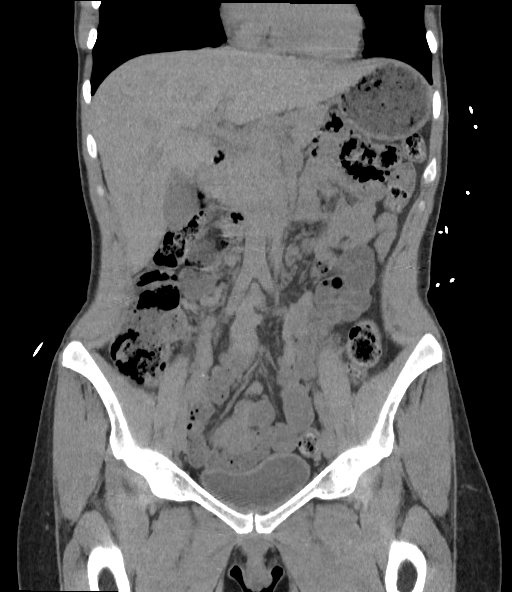
[im 42/94  soft-tissue]
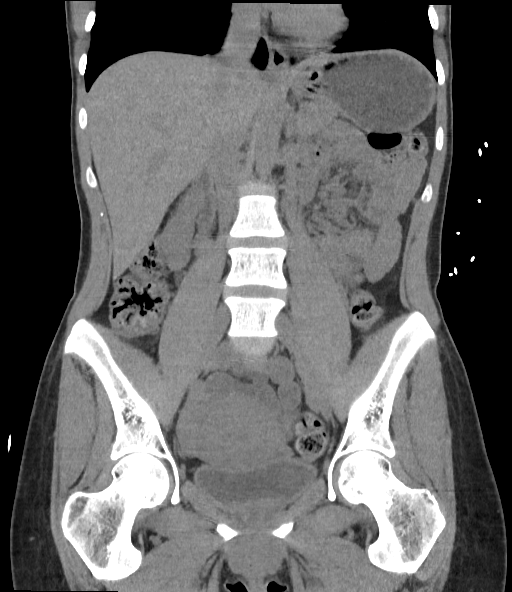
[im 52/94  soft-tissue]
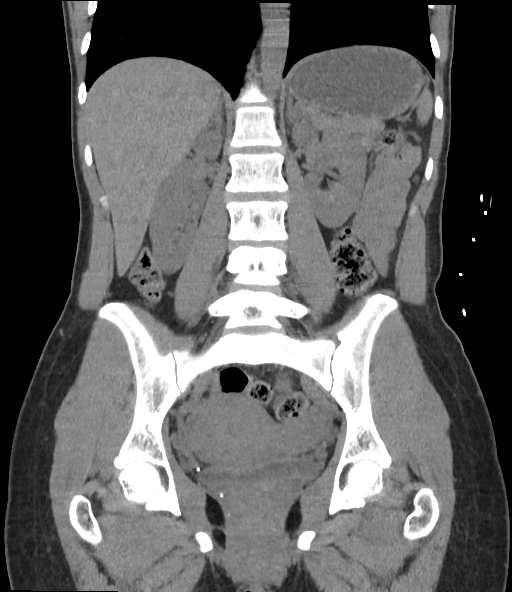

[16 of 46 positions shown; findings below may reference images not displayed]

FINDINGS: Lower chest: No acute abnormality.

Hepatobiliary: No focal liver abnormality is seen. No gallstones,
gallbladder wall thickening, or biliary dilatation.

Pancreas: Unremarkable. No pancreatic ductal dilatation or
surrounding inflammatory changes.

Spleen: Normal in size without focal abnormality.

Adrenals/Urinary Tract: Adrenal glands are unremarkable. Kidneys are
normal, without renal calculi, focal lesion, or hydronephrosis.
Bladder is unremarkable.

Stomach/Bowel: Stomach is within normal limits. Appendix appears
normal. No evidence of bowel wall thickening, distention, or
inflammatory changes.

Vascular/Lymphatic: No significant vascular findings are present. No
enlarged abdominal or pelvic lymph nodes.

Reproductive: Uterus and bilateral adnexa are unremarkable.

Other: No abdominal wall hernia or abnormality. No abdominopelvic
ascites.

Musculoskeletal: No acute or significant osseous findings.
IMPRESSION: No acute or active process within the abdomen or pelvis.

## 2021-11-26 MED ORDER — VANCOMYCIN HCL IN DEXTROSE 1-5 GM/200ML-% IV SOLN: 1000.0000 mg | Freq: Once | INTRAVENOUS | Status: DC

## 2021-11-26 MED ORDER — SODIUM CHLORIDE 0.9 % IV BOLUS (SEPSIS)
1000.0000 mL | Freq: Once | INTRAVENOUS | Status: DC
Start: 2021-11-26 — End: 2021-11-27

## 2021-11-26 MED ORDER — LACTATED RINGERS IV BOLUS (SEPSIS)
1000.0000 mL | Freq: Once | INTRAVENOUS | Status: AC
  Administered 2021-11-26: 1000 mL via INTRAVENOUS

## 2021-11-26 MED ORDER — LACTATED RINGERS IV SOLN: INTRAVENOUS | Status: DC

## 2021-11-26 MED ORDER — SODIUM CHLORIDE 0.9 % IV SOLN
2.0000 g | Freq: Once | INTRAVENOUS | Status: AC
  Administered 2021-11-26: 2 g via INTRAVENOUS
  Filled 2021-11-26: qty 12.5

## 2021-11-26 MED ORDER — LACTATED RINGERS IV BOLUS (SEPSIS)
250.0000 mL | Freq: Once | INTRAVENOUS | Status: AC
  Administered 2021-11-26: 250 mL via INTRAVENOUS

## 2021-11-26 MED ORDER — LACTATED RINGERS IV BOLUS (SEPSIS)
500.0000 mL | Freq: Once | INTRAVENOUS | Status: AC
  Administered 2021-11-26: 500 mL via INTRAVENOUS

## 2021-11-26 MED ORDER — ACETAMINOPHEN 500 MG PO TABS
1000.0000 mg | ORAL_TABLET | Freq: Once | ORAL | Status: AC
  Administered 2021-11-26: 1000 mg via ORAL
  Filled 2021-11-26: qty 2

## 2021-11-26 MED ORDER — METRONIDAZOLE 500 MG/100ML IV SOLN
500.0000 mg | Freq: Once | INTRAVENOUS | Status: AC
  Administered 2021-11-26: 500 mg via INTRAVENOUS
  Filled 2021-11-26: qty 100

## 2021-11-26 MED ORDER — IOHEXOL 300 MG/ML  SOLN
75.0000 mL | Freq: Once | INTRAMUSCULAR | Status: AC | PRN
Start: 2021-11-26 — End: 2021-11-27
  Administered 2021-11-27: 75 mL via INTRAVENOUS

## 2021-11-26 MED ORDER — VANCOMYCIN HCL 1250 MG/250ML IV SOLN
1250.0000 mg | Freq: Once | INTRAVENOUS | Status: AC
  Administered 2021-11-27: 1250 mg via INTRAVENOUS
  Filled 2021-11-26: qty 250

## 2021-11-26 NOTE — ED Provider Notes (Addendum)
Saw11:35 PM  Assumed care at shift change.  Patient here with right flank pain that radiates down her back into her right posterior leg.  Found to be febrile to 102.8 here.  Work-up shows a white blood cell count of 12,000 with left shift.  Urine shows small amount of blood but no other sign of infection.  CT renal study is unremarkable.  COVID flu negative.  D-dimer negative.  Troponin negative.  Will obtain CT of the chest with contrast to evaluate for possible pneumonia given right flank pain could represent a right lower lobe pneumonia.  She has had some URI symptoms. ? ? ?12:35 AM  Pt's CT chest reviewed and interpreted by myself and radiologist and shows no infectious process.  She does have a noncalcified area in the soft tissue of the right breast.  Discussed these findings and recommended mammography as an outpatient.  She does have diffuse T and L-spine tenderness on exam but no focal neurologic deficits.  Reports she had an epidural with her last pregnancy about 10 months ago.  No other back surgeries, epidural injections.  No IV drug abuse, diabetes or cancer.  ? ? ?3:15 AM  Pt's MRI is reviewed and interpreted by myself and radiology and show no infection or other acute abnormality.  Lactic normal.  Procalcitonin negative.  Symptoms may be due to a viral illness.  She has fever and leukocytosis but otherwise her work-up has been reassuring and she is nontoxic, tolerating p.o. and hemodynamically stable.  I feel it is reasonable for her to be discharged home with close outpatient follow-up.  Patient is comfortable with this plan and would prefer this over observation admission.  She does have a PCP for follow-up.  Recommend alternating Tylenol, Motrin.  Given no obvious source of bacterial infection and negative procalcitonin, I do not feel she needs to be started on antibiotics.  She denies any recent tick bites, sick contacts or recent travel.  No meningismus.  No ear pain, sore throat.  She has had  cough, body aches, back pain.  No urinary symptoms.  No abdominal pain.  No vomiting or diarrhea. ? ? ?At this time, I do not feel there is any life-threatening condition present. I reviewed all nursing notes, vitals, pertinent previous records.  All lab and urine results, EKGs, imaging ordered have been independently reviewed and interpreted by myself.  I reviewed all available radiology reports from any imaging ordered this visit.  Based on my assessment, I feel the patient is safe to be discharged home without further emergent workup and can continue workup as an outpatient as needed. Discussed all findings, treatment plan as well as usual and customary return precautions with patient and family.  They verbalize understanding and are comfortable with this plan.  Outpatient follow-up has been provided as needed.  All questions have been answered. ? ?  ?Cezar Misiaszek, Layla Maw, DO ?11/27/21 0319 ? ? ? ?5:35 AM  I spoke to the patient by phone regarding the findings on CT imaging of the right breast.  She states she has had pain in this breast even before her child was born her child is now 87 months old.  She has no redness or warmth over this area no nipple drainage.  She is not breast-feeding.  I do not think this is the cause of her fever today but we have recommended close follow-up with her PCP for a mammogram.  She states she has had a mammogram previously and has had  tissue mass resected before.  No known history of breast cancer.  She verbalizes understanding of these results and importance of close outpatient follow-up with her PCP for mammography. ?  ?Rommie Dunn, Layla Maw, DO ?11/27/21 0537 ? ?

## 2021-11-26 NOTE — ED Provider Notes (Signed)
? ?[facility] ?Provider Note ? ? ? Event Date/Time  ? First MD Initiated Contact with Patient 11/26/21 2021   ?  (approximate) ? ? ?History  ? ?Back Pain and Chest Pain ? ? ?HPI ? ?Christina Roth is a 31 y.o. female who reports she was doing well but this morning began with a cold she thought stuffy nose.  This is since gotten worse.  She is having some diffuse body aches.  She has pain in the right CVA area that sharp and radiates down to her leg.  She does not have any pain in her abdomen.  She complains of shortness of breath.  She has a mild cough but nothing comes out. ? ?  ? ? ?Physical Exam  ? ?Triage Vital Signs: ?ED Triage Vitals  ?Enc Vitals Group  ?   BP 11/26/21 2006 108/72  ?   Pulse Rate 11/26/21 2006 (!) 105  ?   Resp 11/26/21 2006 18  ?   Temp 11/26/21 2006 98.7 ?F (37.1 ?C)  ?   Temp Source 11/26/21 2006 Oral  ?   SpO2 11/26/21 2006 98 %  ?   Weight 11/26/21 2003 125 lb (56.7 kg)  ?   Height 11/26/21 2003 5\' 7"  (1.702 m)  ?   Head Circumference --   ?   Peak Flow --   ?   Pain Score 11/26/21 2003 8  ?   Pain Loc --   ?   Pain Edu? --   ?   Excl. in GC? --   ? ? ?Most recent vital signs: ?Vitals:  ? 11/26/21 2230 11/26/21 2238  ?BP:    ?Pulse: 98   ?Resp: 17   ?Temp:  (!) 102.8 ?F (39.3 ?C)  ?SpO2: 98%   ? ? ? ?General: Awake, no distress.  ?CV:  Good peripheral perfusion.  ?Resp:  Normal effort.  Lungs are clear ?Abd:  No distention.  Abdomen soft bowel sounds positive nontender but there is tenderness on palpation in and percussion of the right CVA area ?Extremities with no edema ? ? ?ED Results / Procedures / Treatments  ? ?Labs ?(all labs ordered are listed, but only abnormal results are displayed) ?Labs Reviewed  ?BASIC METABOLIC PANEL - Abnormal; Notable for the following components:  ?    Result Value  ? Glucose, Bld 117 (*)   ? All other components within normal limits  ?CBC - Abnormal; Notable for the following components:  ? WBC 12.8 (*)   ? All other components  within normal limits  ?URINALYSIS, ROUTINE W REFLEX MICROSCOPIC - Abnormal; Notable for the following components:  ? Color, Urine YELLOW (*)   ? APPearance CLEAR (*)   ? Hgb urine dipstick MODERATE (*)   ? Bacteria, UA RARE (*)   ? All other components within normal limits  ?HEPATIC FUNCTION PANEL - Abnormal; Notable for the following components:  ? Indirect Bilirubin 1.0 (*)   ? All other components within normal limits  ?CBC WITH DIFFERENTIAL/PLATELET - Abnormal; Notable for the following components:  ? WBC 12.5 (*)   ? Neutro Abs 10.8 (*)   ? All other components within normal limits  ?RESP PANEL BY RT-PCR (FLU A&B, COVID) [id number]  ?CULTURE, BLOOD (SINGLE)  ?D-DIMER, QUANTITATIVE  ?CBC WITH DIFFERENTIAL/PLATELET  ?POC URINE PREG, ED  ?TROPONIN I (HIGH SENSITIVITY)  ?TROPONIN I (HIGH SENSITIVITY)  ? ? ? ?EKG ? ? ? ? ?RADIOLOGY ?Chest x-ray read by radiology reviewed by  me is negative ? ? ?PROCEDURES: ? ?Critical Care performed:  ? ?Procedures ? ? ?MEDICATIONS ORDERED IN ED: ?Medications  ?lactated ringers infusion (has no administration in time range)  ?lactated ringers bolus 1,000 mL (has no administration in time range)  ?  And  ?lactated ringers bolus 500 mL (has no administration in time range)  ?  And  ?lactated ringers bolus 250 mL (has no administration in time range)  ?ceFEPIme (MAXIPIME) 2 g in sodium chloride 0.9 % 100 mL IVPB (has no administration in time range)  ?metroNIDAZOLE (FLAGYL) IVPB 500 mg (has no administration in time range)  ?vancomycin (VANCOREADY) IVPB 1250 mg/250 mL (has no administration in time range)  ?acetaminophen (TYLENOL) tablet 1,000 mg (1,000 mg Oral Given 11/26/21 2246)  ? ? ? ?IMPRESSION / MDM / ASSESSMENT AND PLAN / ED COURSE  ?I reviewed the triage vital signs and the nursing notes. ?----------------------------------------- ?10:51 PM on 11/26/2021 ?----------------------------------------- ?Patient now has developed a fever meet sepsis criteria.  I will begin sepsis  treatment.  We will get a CT renal to make sure there is no obstructing stones causing her pain.  Patient does have pain rating down into the posterior left leg.  But there is no tenderness over the spine.  I believe the CT renal should be good enough study to make sure there is no other kind of infectious process in the belly. ? ?----------------------------------------- ?11:14 PM on 11/26/2021 ?----------------------------------------- ?Patient with CTR pending.  I expect she will need to come into the hospital.  I am signing the patient out to oncoming physician. ?  ? ? ?FINAL CLINICAL IMPRESSION(S) / ED DIAGNOSES  ? ?Final diagnoses:  ?Mid back pain on right side  ?Fever, unspecified fever cause  ?Acute cough  ? ? ? ?Rx / DC Orders  ? ?ED Discharge Orders   ? ? None  ? ?  ? ? ? ?Note:  This document was prepared using Dragon voice recognition software and may include unintentional dictation errors. ?  ?Arnaldo Natal, MD ?11/26/21 2314 ? ?

## 2021-11-26 NOTE — Progress Notes (Signed)
PHARMACY -  BRIEF ANTIBIOTIC NOTE  ? ?Pharmacy has received consult(s) for Vanc, cefepime from an ED provider.  The patient's profile has been reviewed for ht/wt/allergies/indication/available labs.   ? ?One time order(s) placed for Vancomycin 1250 mg IV X 1 and cefepime 2 gm IV X 1.  ? ?Further antibiotics/pharmacy consults should be ordered by admitting physician if indicated.       ?                ?Thank you, ?Kosisochukwu Goldberg D ?11/26/2021  11:03 PM ? ?

## 2021-11-26 NOTE — Progress Notes (Signed)
CODE SEPSIS - PHARMACY COMMUNICATION ? ?**Broad Spectrum Antibiotics should be administered within 1 hour of Sepsis diagnosis** ? ?Time Code Sepsis Called/Page Received:  5/10 @ 2249  ? ?Antibiotics Ordered: Vanc, Cefepime , metronidazole  ? ?Time of 1st antibiotic administration: Cefepime 2 gm IV X 1 on 5/10 @ 2317  ? ?Additional action taken by pharmacy: none  ? ?If necessary, Name of Provider/Nurse Contacted:  ? ? ? ?Kloey Cazarez D ,PharmD ?Clinical Pharmacist  ?11/26/2021  11:27 PM ? ?

## 2021-11-26 NOTE — ED Triage Notes (Signed)
Pt presents via POV with multiple complaints including CP, SOB, back pain, body aches, and cold like sx starting today. She endorses a mild cough that causes her chest to her and it radiates towards her back. Denies urinary sx.  ?

## 2021-11-26 NOTE — ED Notes (Signed)
Pt placed on cardiac monitor. MD Malinda at bedside.  ?

## 2021-11-27 ENCOUNTER — Emergency Department: Payer: BC Managed Care – PPO

## 2021-11-27 LAB — LACTIC ACID, PLASMA: Lactic Acid, Venous: 0.9 mmol/L (ref 0.5–1.9)

## 2021-11-27 LAB — PROCALCITONIN: Procalcitonin: <0.1 ng/mL

## 2021-11-27 IMAGING — MR MR THORACIC SPINE WO/W CM
7 of 9 series · 32 of 48 positions shown · IV contrast (6ml Gadavist)
Comparison: Prior radiograph from [DATE]

CLINICAL DATA: Initial evaluation for acute back pain, body aches,
cold-like symptoms, cough.

EXAM:
MRI THORACIC AND LUMBAR SPINE WITHOUT AND WITH CONTRAST
TECHNIQUE: Multiplanar and multiecho pulse sequences of the thoracic and lumbar
spine were obtained without and with intravenous contrast.
CONTRAST:  6mL GADAVIST GADOBUTROL 1 MMOL/ML IV SOLN

[Series 5: T1 · sagittal · 5.0mm · 1.88mm/px · 1 of 9 slices shown (1 of 3)]
[im 1/9]
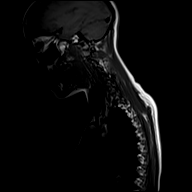

[Series 6: T2 · sagittal · 3.0mm · 1.33mm/px · 3 of 17 slices shown (1 of 2)]
[im 1/17]
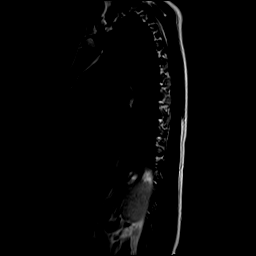
[im 9/17]
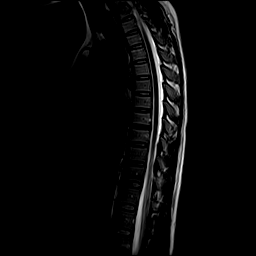
[im 17/17]
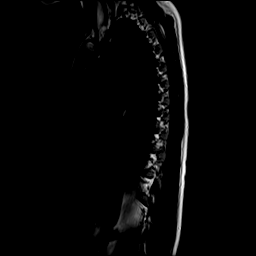

[Series 7: T1 · sagittal · 3.0mm · 1.33mm/px · 4 of 17 slices shown (2 of 3)]
[im 1/17]
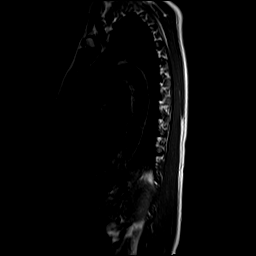
[im 6/17]
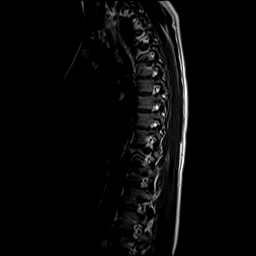
[im 11/17]
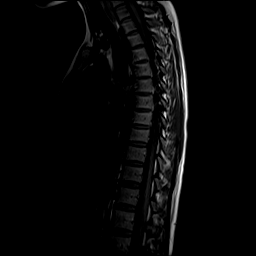
[im 17/17]
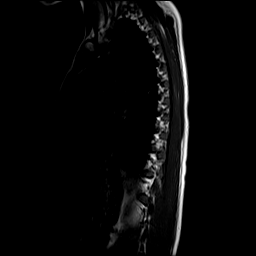

[Series 8: STIR · sagittal · 3.0mm · 0.66mm/px · 4 of 17 slices shown]
[im 1/17]
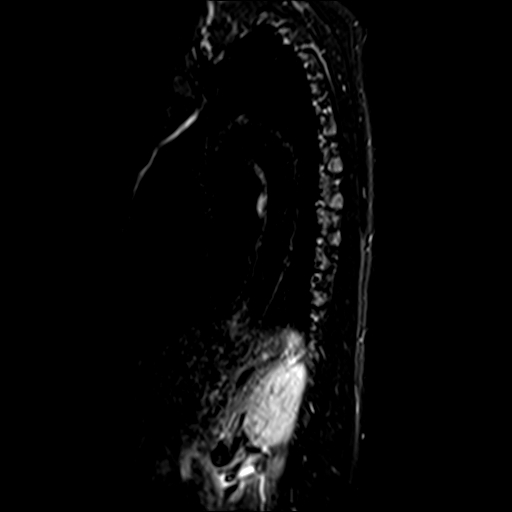
[im 6/17]
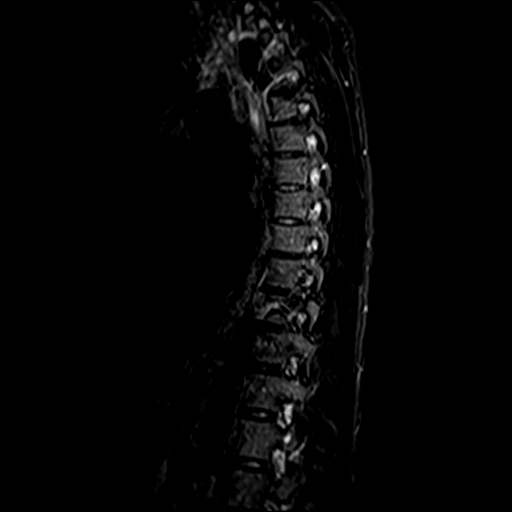
[im 11/17]
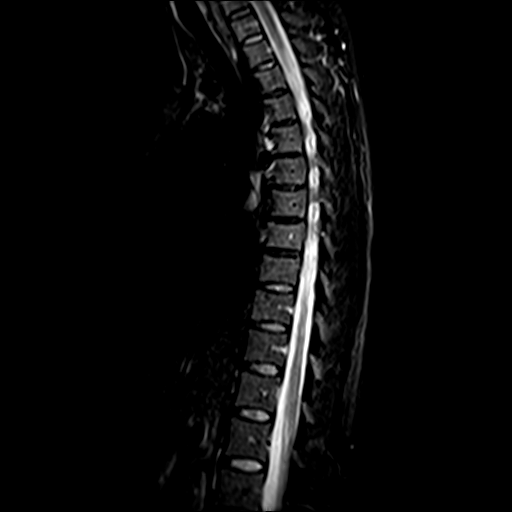
[im 17/17]
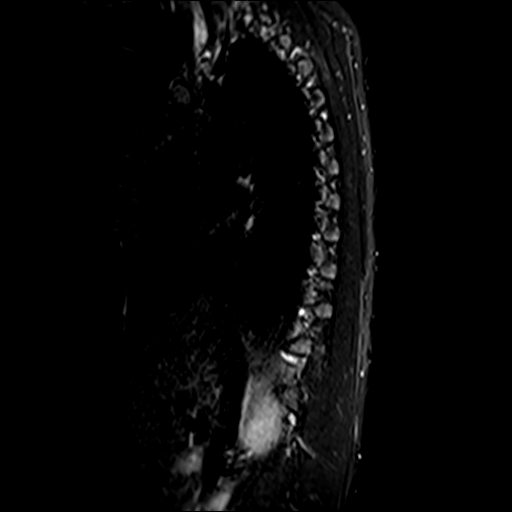

[Series 9: T2 · axial · 4.0mm · 0.74mm/px · z∈[-289,-92]mm · 8 of 36 slices shown (2 of 2)]
[im 1/36]
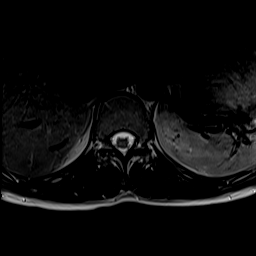
[im 6/36]
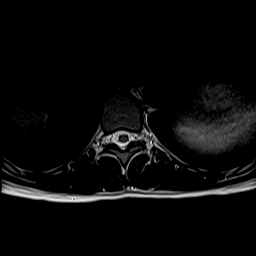
[im 11/36]
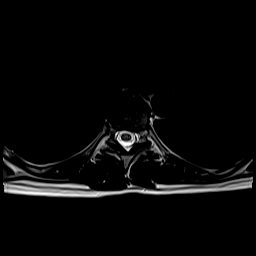
[im 16/36]
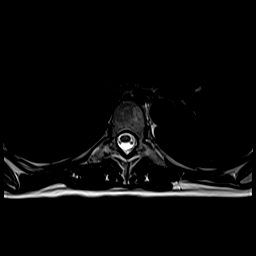
[im 21/36]
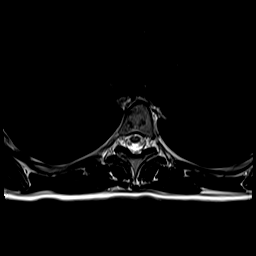
[im 26/36]
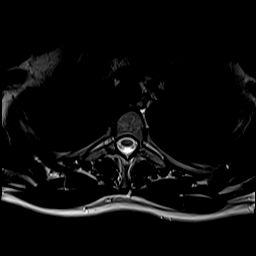
[im 31/36]
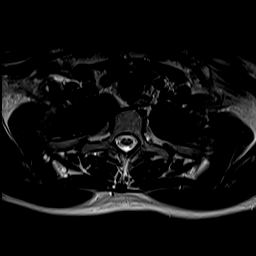
[im 36/36]
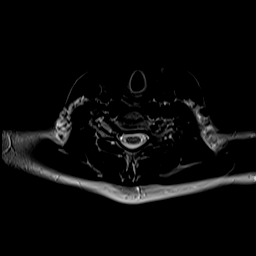

[Series 11: T1 · axial · non-contrast · 4.0mm · 0.37mm/px · z∈[-289,-68]mm · 8 of 39 slices shown (3 of 3)]
[im 1/39]
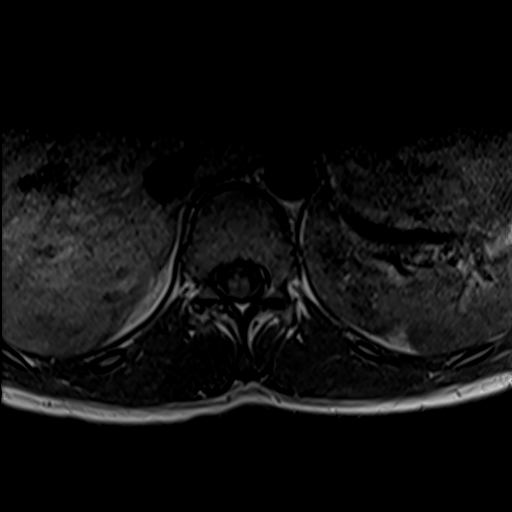
[im 6/39]
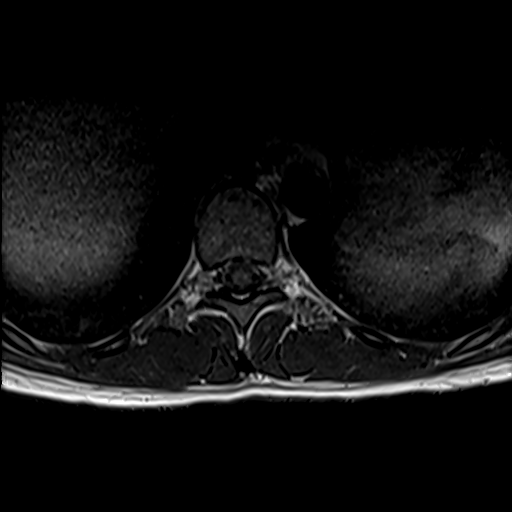
[im 11/39]
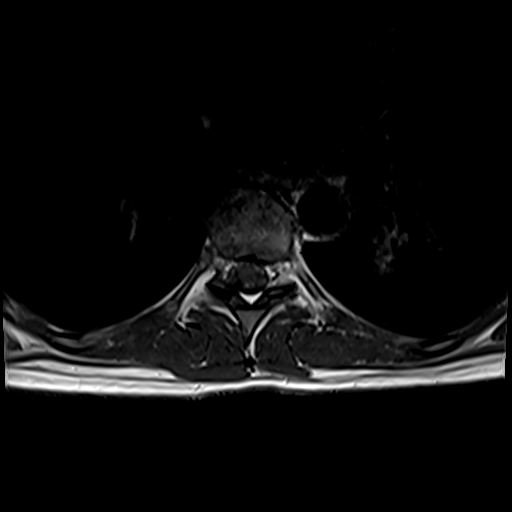
[im 17/39]
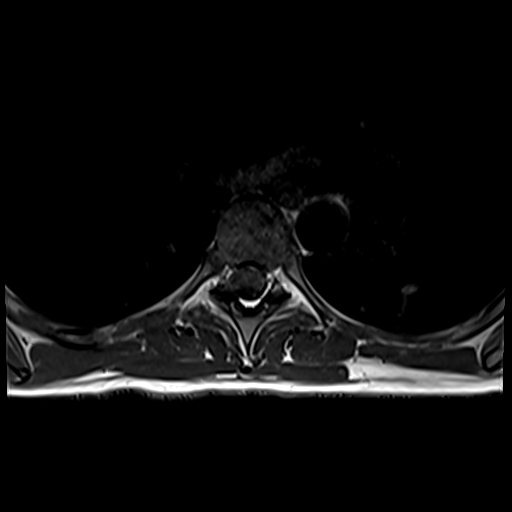
[im 22/39]
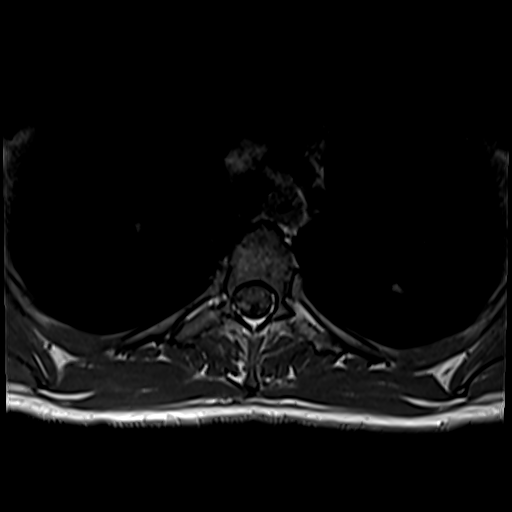
[im 28/39]
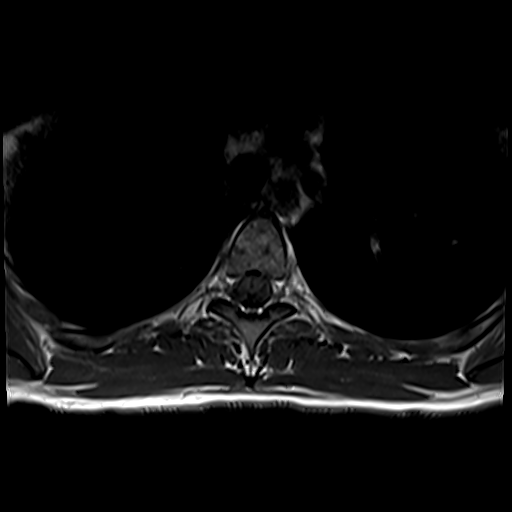
[im 33/39]
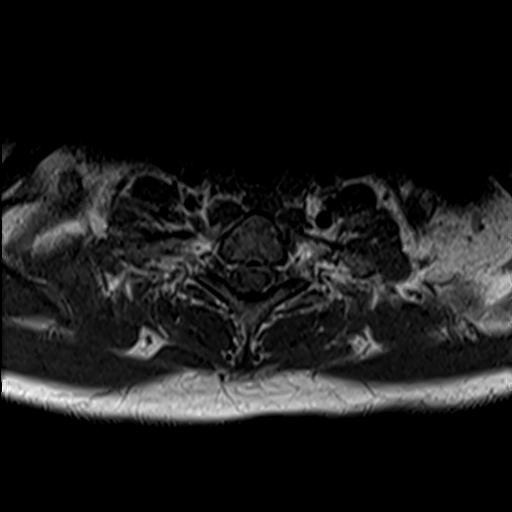
[im 39/39]
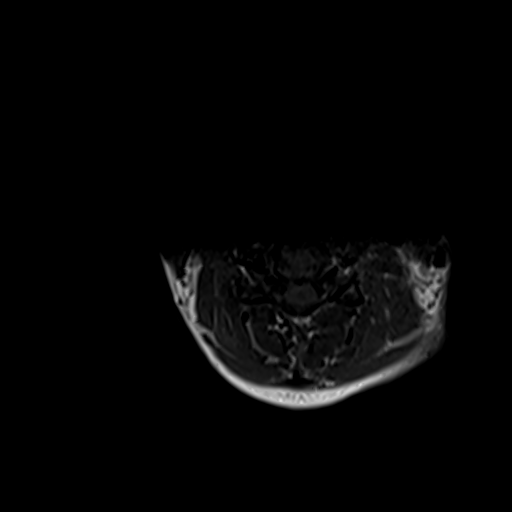

[Series 13: T1 fat-sat post-contrast · sagittal · 3.0mm · 1.33mm/px · 4 of 17 slices shown]
[im 1/17]
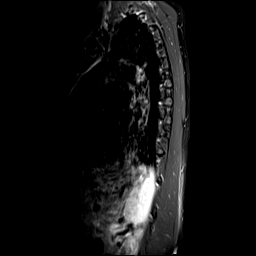
[im 6/17]
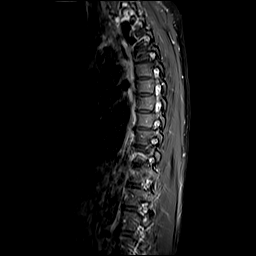
[im 11/17]
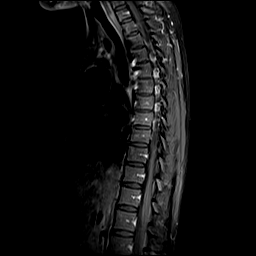
[im 17/17]
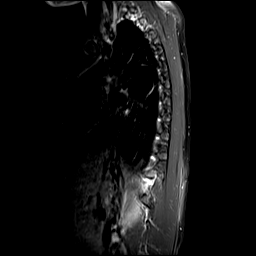

[32 of 48 positions shown; findings below may reference images not displayed]

FINDINGS: MRI THORACIC SPINE FINDINGS

Alignment: Mild scoliosis. Alignment otherwise normal preservation
of the normal thoracic kyphosis. No listhesis.

Vertebrae: Vertebral body height well maintained without acute or
chronic fracture. Bone marrow signal intensity within normal limits.
No discrete or worrisome osseous lesions. No abnormal marrow edema
or enhancement. No evidence for osteomyelitis discitis or septic
arthritis.

Cord: Normal signal and morphology. No abnormal enhancement. No
epidural abscess or other collections.

Paraspinal and other soft tissues: Visualized paraspinous soft
tissues are within normal limits.

Disc levels:

No significant disc pathology seen within the thoracic spine. No
disc bulge or focal disc herniation. No significant facet pathology.
No canal or neural foraminal stenosis or evidence for neural
impingement.

MRI LUMBAR SPINE FINDINGS

Segmentation: Standard. Lowest well-formed disc space labeled the
L5-S1 level.

Alignment: Physiologic with preservation of the normal lumbar
lordosis. No listhesis.

Vertebrae: Vertebral body height maintained without acute or chronic
fracture. Bone marrow signal intensity within normal limits. No
discrete or worrisome osseous lesions. No abnormal marrow edema or
enhancement. No evidence for osteomyelitis discitis or septic
arthritis.

Conus medullaris: Extends to the L1 level and appears normal. No
abnormal enhancement. No epidural abscess or other collection.

Paraspinal and other soft tissues: Unremarkable.

Disc levels:

No significant disc pathology seen within the lumbar spine.
Intervertebral discs are well hydrated with preserved disc height.
No disc bulge or focal disc herniation. No significant facet
disease. No canal or neural foraminal stenosis or evidence for
neural impingement.
IMPRESSION: Normal MRI of the thoracolumbar spine. No findings to explain
patient's symptoms identified.

## 2021-11-27 IMAGING — MR MR LUMBAR SPINE WO/W CM
6 of 8 series · 33 of 48 positions shown · IV contrast (6ml Gadavist)
Comparison: Prior radiograph from [DATE]

CLINICAL DATA: Initial evaluation for acute back pain, body aches,
cold-like symptoms, cough.

EXAM:
MRI THORACIC AND LUMBAR SPINE WITHOUT AND WITH CONTRAST
TECHNIQUE: Multiplanar and multiecho pulse sequences of the thoracic and lumbar
spine were obtained without and with intravenous contrast.
CONTRAST:  6mL GADAVIST GADOBUTROL 1 MMOL/ML IV SOLN

[Series 20: T2 · sagittal · 4.0mm · 1.02mm/px · 4 of 17 slices shown (1 of 2)]
[im 1/17]
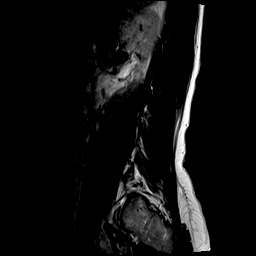
[im 6/17]
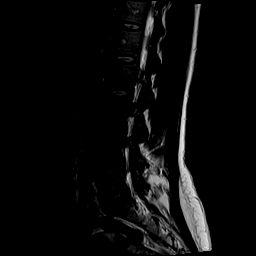
[im 11/17]
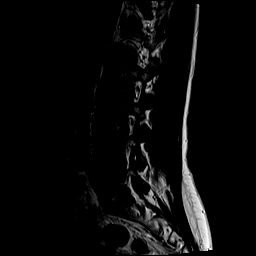
[im 17/17]
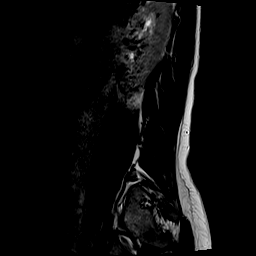

[Series 21: T1 · sagittal · 4.0mm · 1.02mm/px · 4 of 17 slices shown (1 of 2)]
[im 1/17]
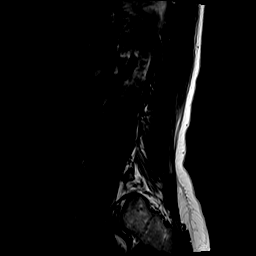
[im 6/17]
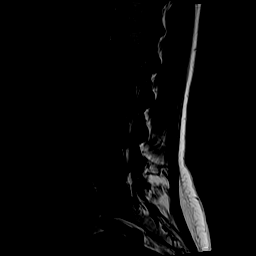
[im 11/17]
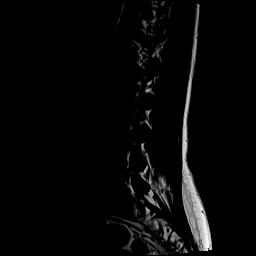
[im 17/17]
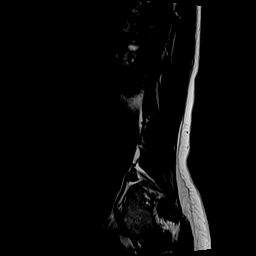

[Series 22: STIR · sagittal · 4.0mm · 0.51mm/px · 3 of 17 slices shown]
[im 1/17]
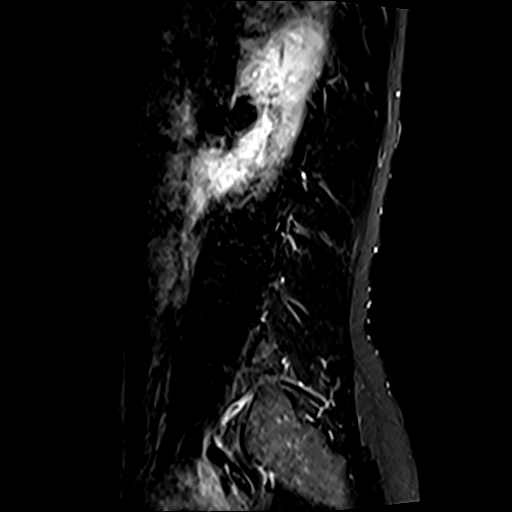
[im 6/17]
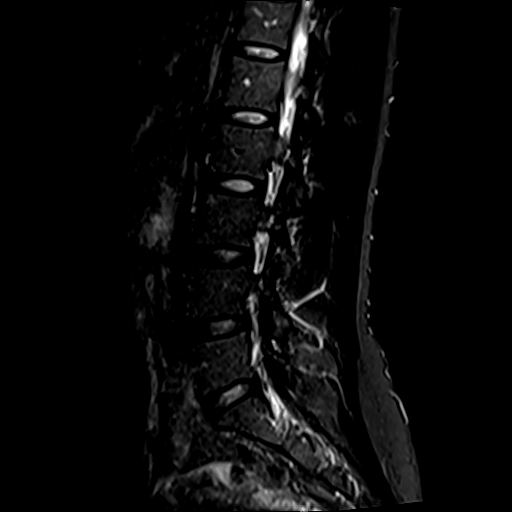
[im 11/17]
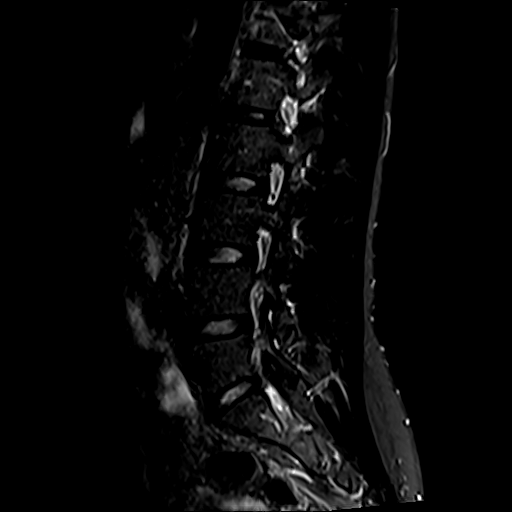

[Series 23: T2 · axial · 4.0mm · 0.78mm/px · z∈[-529,-304]mm · 9 of 38 slices shown (2 of 2)]
[im 1/38]
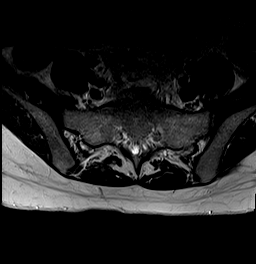
[im 5/38]
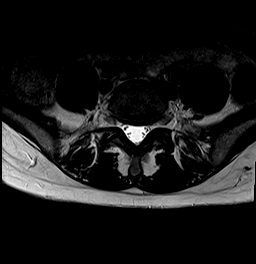
[im 10/38]
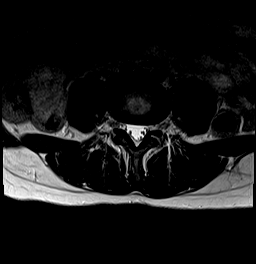
[im 14/38]
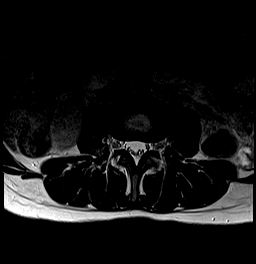
[im 19/38]
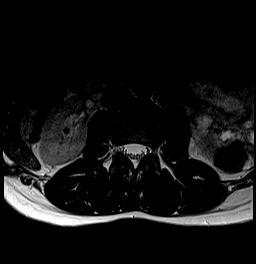
[im 24/38]
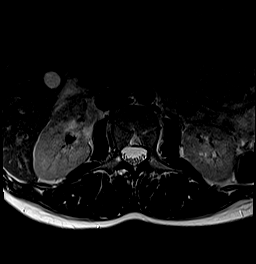
[im 28/38]
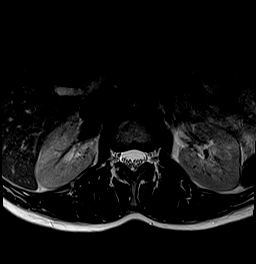
[im 33/38]
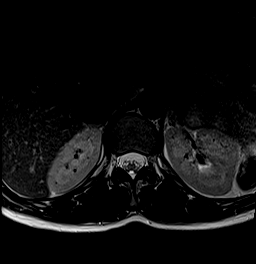
[im 38/38]
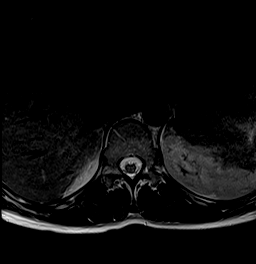

[Series 24: T1 · axial · 4.0mm · 0.39mm/px · z∈[-529,-305]mm · 9 of 38 slices shown (2 of 2)]
[im 1/38]
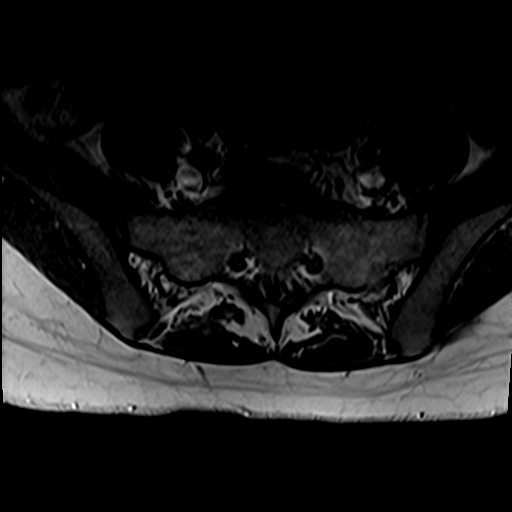
[im 5/38]
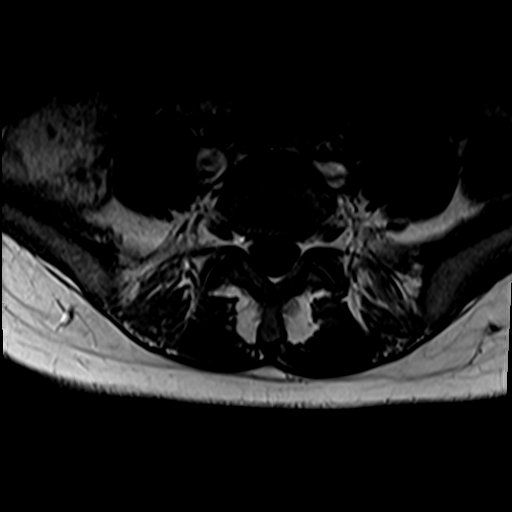
[im 10/38]
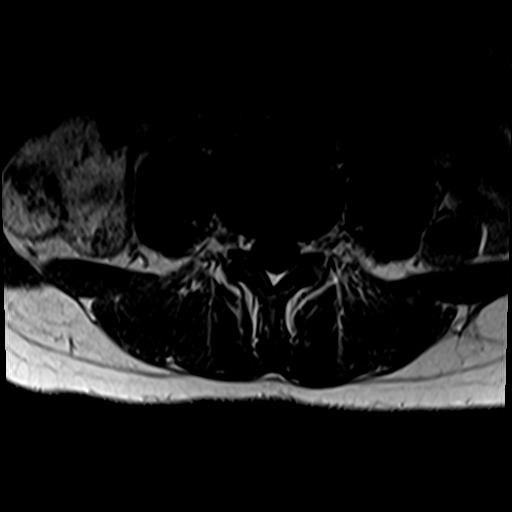
[im 14/38]
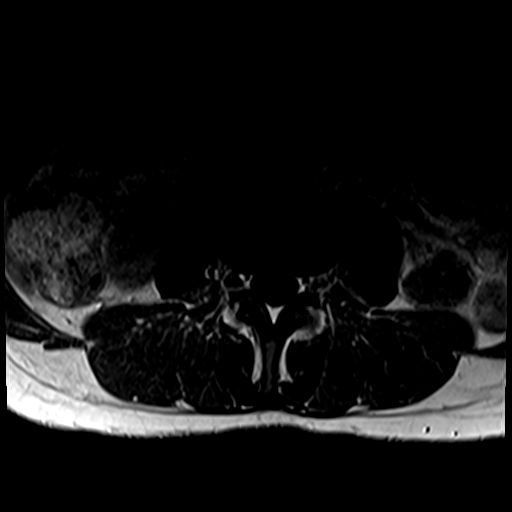
[im 19/38]
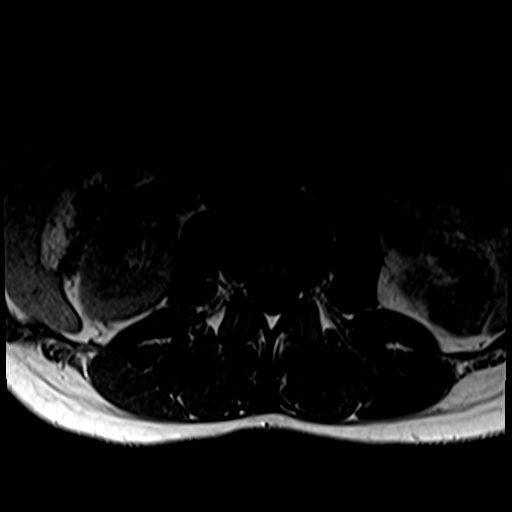
[im 24/38]
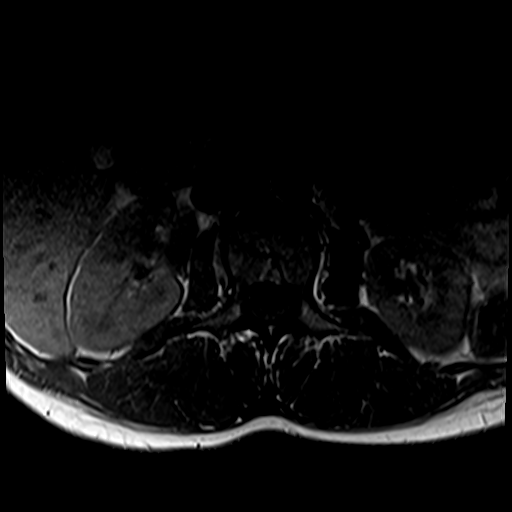
[im 28/38]
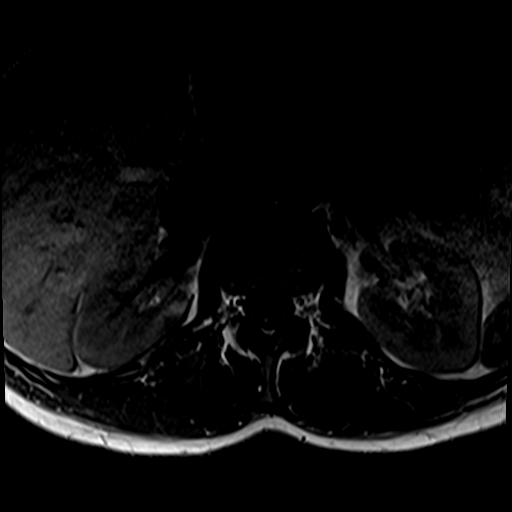
[im 33/38]
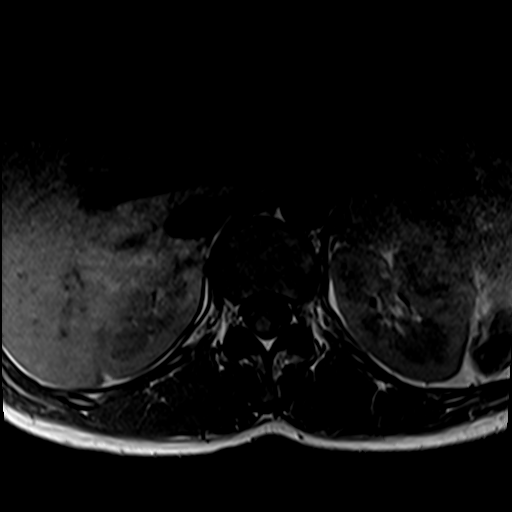
[im 38/38]
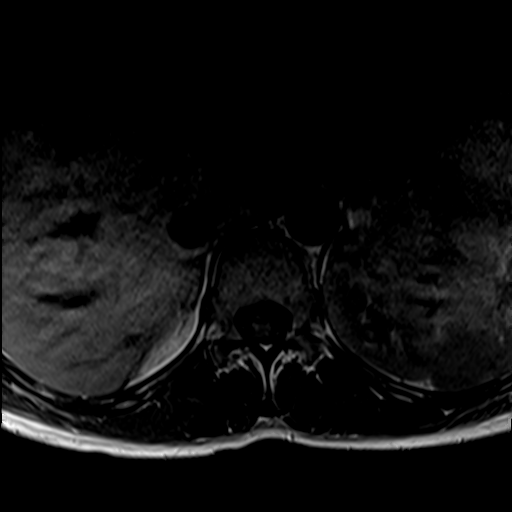

[Series 25: T1 fat-sat post-contrast · sagittal · 4.0mm · 1.02mm/px · 4 of 17 slices shown]
[im 1/17]
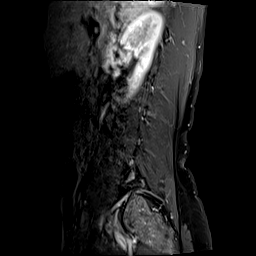
[im 6/17]
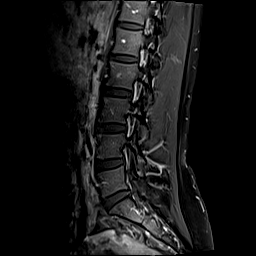
[im 11/17]
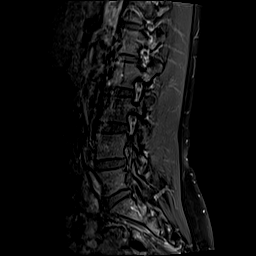
[im 17/17]
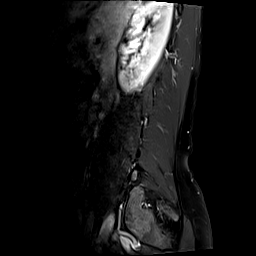

[33 of 48 positions shown; findings below may reference images not displayed]

FINDINGS: MRI THORACIC SPINE FINDINGS

Alignment: Mild scoliosis. Alignment otherwise normal preservation
of the normal thoracic kyphosis. No listhesis.

Vertebrae: Vertebral body height well maintained without acute or
chronic fracture. Bone marrow signal intensity within normal limits.
No discrete or worrisome osseous lesions. No abnormal marrow edema
or enhancement. No evidence for osteomyelitis discitis or septic
arthritis.

Cord: Normal signal and morphology. No abnormal enhancement. No
epidural abscess or other collections.

Paraspinal and other soft tissues: Visualized paraspinous soft
tissues are within normal limits.

Disc levels:

No significant disc pathology seen within the thoracic spine. No
disc bulge or focal disc herniation. No significant facet pathology.
No canal or neural foraminal stenosis or evidence for neural
impingement.

MRI LUMBAR SPINE FINDINGS

Segmentation: Standard. Lowest well-formed disc space labeled the
L5-S1 level.

Alignment: Physiologic with preservation of the normal lumbar
lordosis. No listhesis.

Vertebrae: Vertebral body height maintained without acute or chronic
fracture. Bone marrow signal intensity within normal limits. No
discrete or worrisome osseous lesions. No abnormal marrow edema or
enhancement. No evidence for osteomyelitis discitis or septic
arthritis.

Conus medullaris: Extends to the L1 level and appears normal. No
abnormal enhancement. No epidural abscess or other collection.

Paraspinal and other soft tissues: Unremarkable.

Disc levels:

No significant disc pathology seen within the lumbar spine.
Intervertebral discs are well hydrated with preserved disc height.
No disc bulge or focal disc herniation. No significant facet
disease. No canal or neural foraminal stenosis or evidence for
neural impingement.
IMPRESSION: Normal MRI of the thoracolumbar spine. No findings to explain
patient's symptoms identified.

## 2021-11-27 IMAGING — CT CT CHEST W/ CM
2 of 4 series · 15 of 36 positions shown, 18 images · IV contrast (agent unspecified)
Comparison: None Available.

CLINICAL DATA: Chest pain, shortness of breath, back pain and body
aches.

EXAM:
CT CHEST WITH CONTRAST
TECHNIQUE: Multidetector CT imaging of the chest was performed during
intravenous contrast administration.

[Series 2: axial st · axial · 0.62mm/px · z∈[-299,-53]mm · 12 of 147 slices shown, 15 images]
[im 12/147  mediastinal]
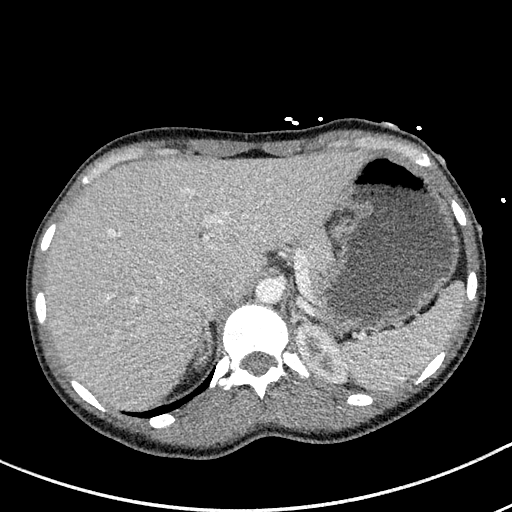
[im 12/147  lung]
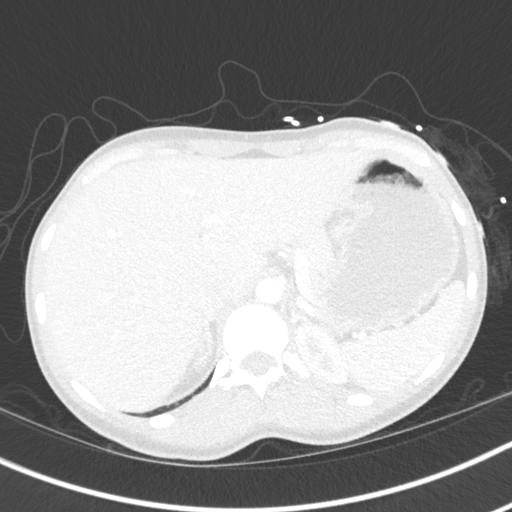
[im 23/147  lung]
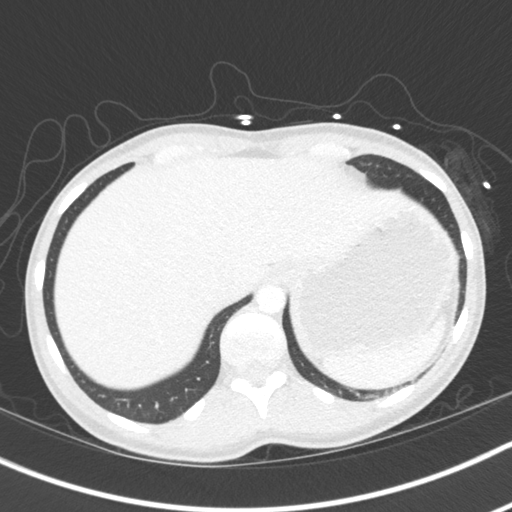
[im 34/147  lung]
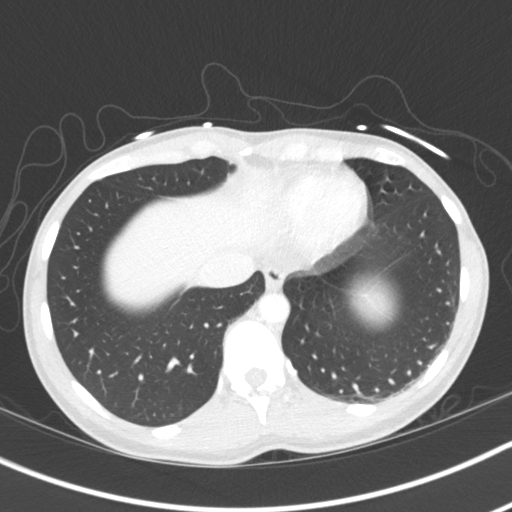
[im 45/147  lung]
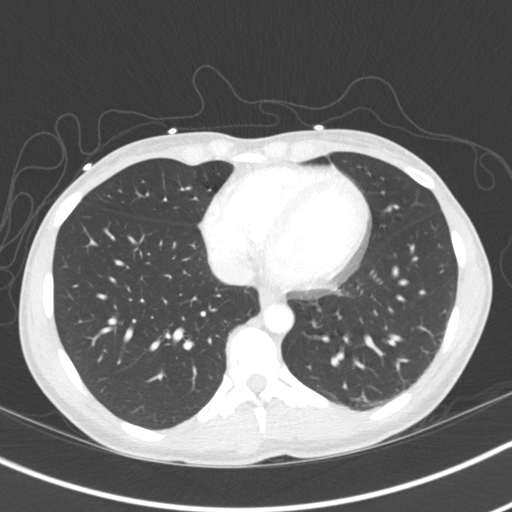
[im 57/147  mediastinal]
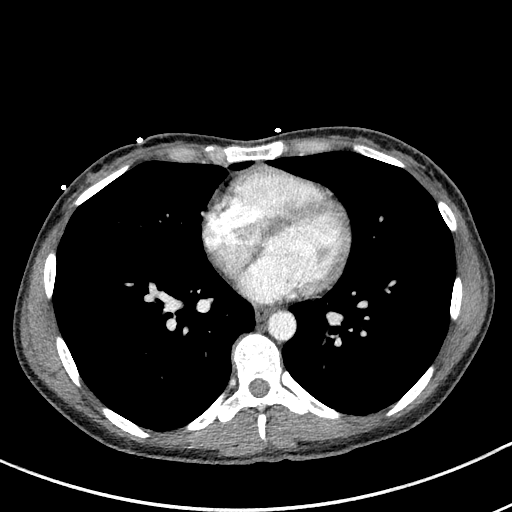
[im 57/147  lung]
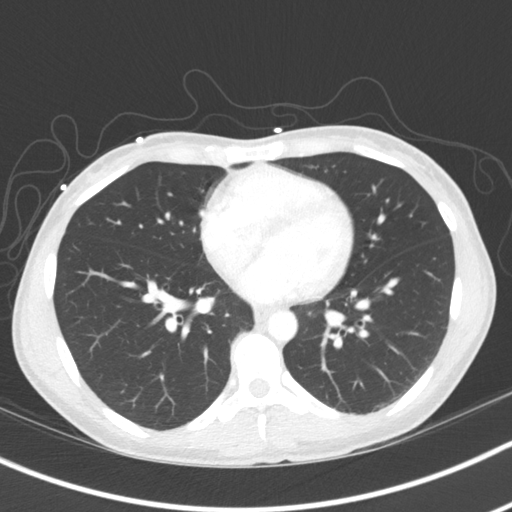
[im 68/147  lung]
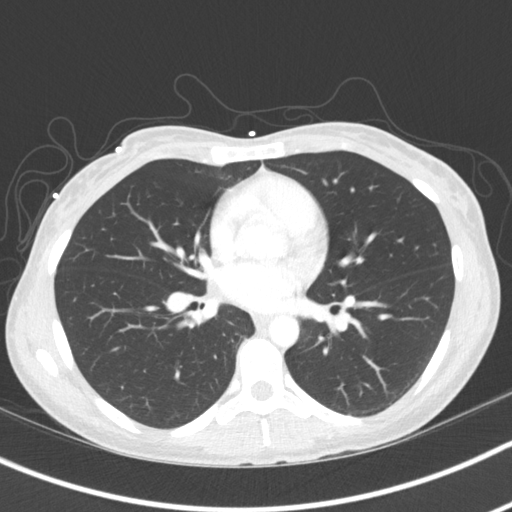
[im 79/147  lung]
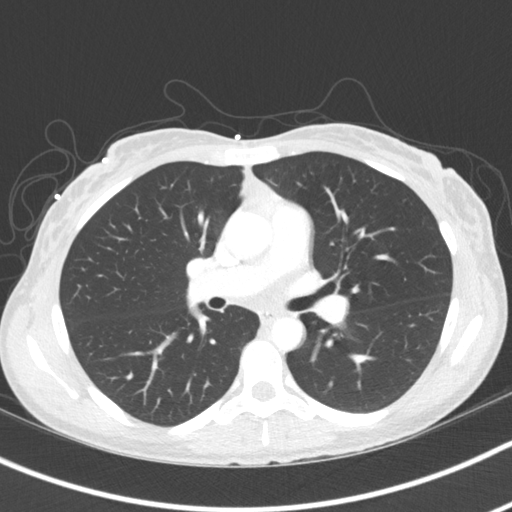
[im 90/147  lung]
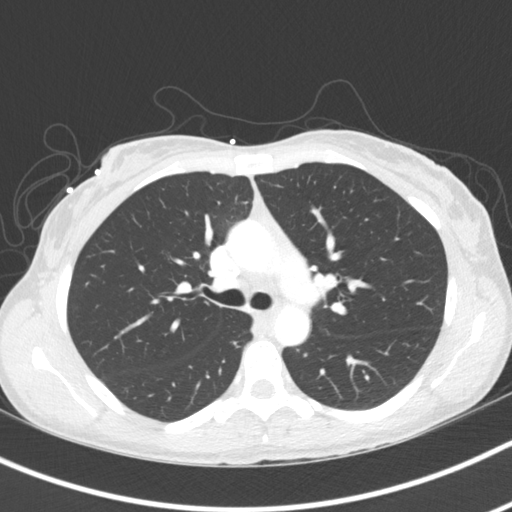
[im 102/147  mediastinal]
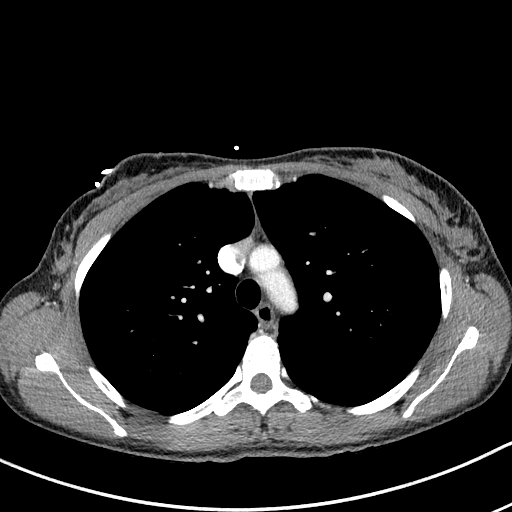
[im 102/147  lung]
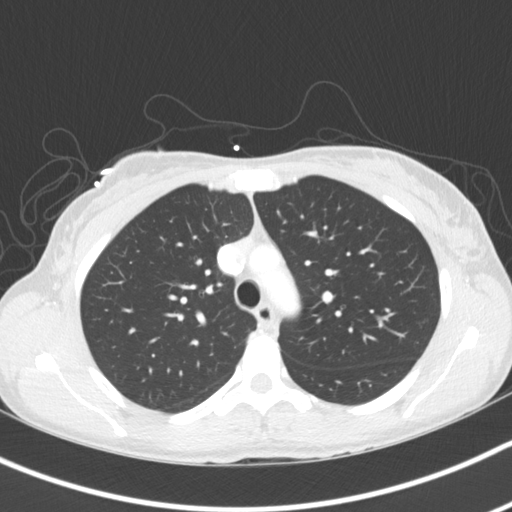
[im 113/147  lung]
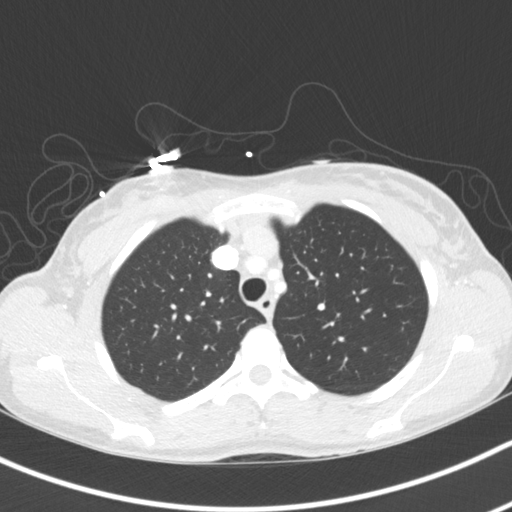
[im 124/147  lung]
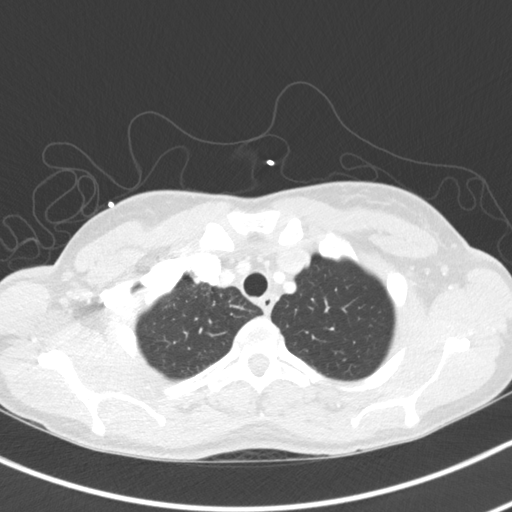
[im 135/147  lung]
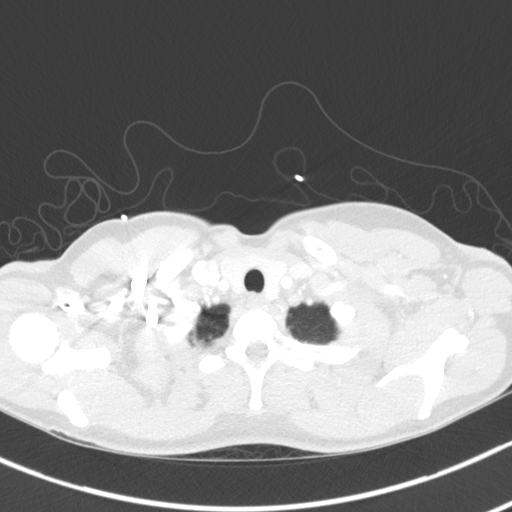

[Series 5: coronal · coronal · 0.58mm/px · 3 of 109 slices shown]
[im 22/109  lung]
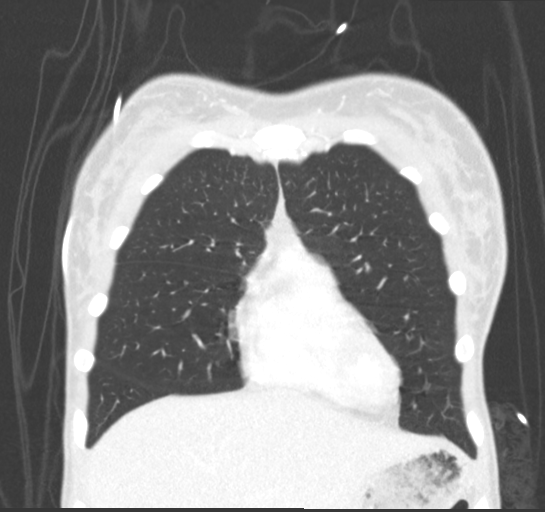
[im 44/109  lung]
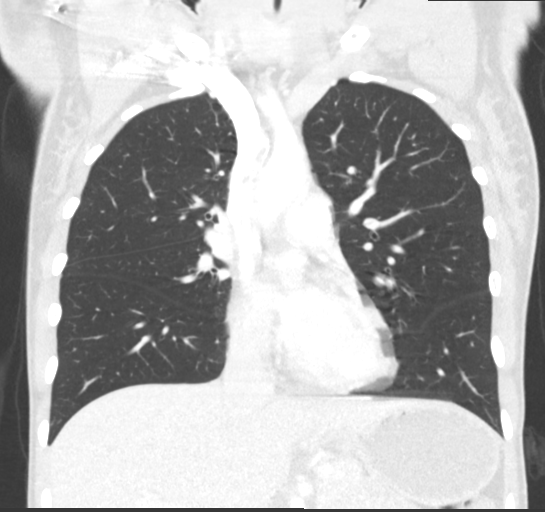
[im 65/109  lung]
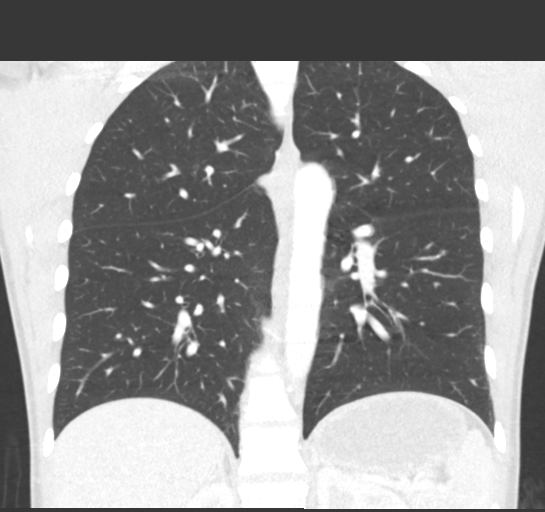

[15 of 36 positions shown; findings below may reference images not displayed]

RADIATION DOSE REDUCTION: This exam was performed according to the
departmental dose-optimization program which includes automated
exposure control, adjustment of the mA and/or kV according to
patient size and/or use of iterative reconstruction technique.

CONTRAST:  75mL OMNIPAQUE IOHEXOL 300 MG/ML  SOLN
FINDINGS: Cardiovascular: No significant vascular findings. Normal heart size.
No pericardial effusion.

Mediastinum/Nodes: No enlarged mediastinal, hilar, or axillary lymph
nodes. Thyroid gland, trachea, and esophagus demonstrate no
significant findings.

Lungs/Pleura: Lungs are clear. No pleural effusion or pneumothorax.

Upper Abdomen: No acute abnormality.

Musculoskeletal: A 1.7 cm x 1.8 cm x 2.9 cm noncalcified area of
soft tissue attenuation (approximately 37.10 Hounsfield units) is
seen within the upper inner quadrant of the right breast (axial CT
images 48 through 60, CT series 2).

No acute osseous abnormalities are identified.
IMPRESSION: 1. No acute or active cardiopulmonary disease.
2. A 1.7 cm x 1.8 cm x 2.9 cm noncalcified area of soft tissue
attenuation is seen within the upper inner quadrant of the right
breast. Correlation with physical examination and mammography is
recommended to further exclude the presence of an underlying
neoplastic process.

## 2021-11-27 MED ORDER — KETOROLAC TROMETHAMINE 30 MG/ML IJ SOLN
30.0000 mg | Freq: Once | INTRAMUSCULAR | Status: AC
Start: 2021-11-27 — End: 2021-11-27
  Administered 2021-11-27: 30 mg via INTRAVENOUS
  Filled 2021-11-27: qty 1

## 2021-11-27 MED ORDER — GADOBUTROL 1 MMOL/ML IV SOLN
6.0000 mL | Freq: Once | INTRAVENOUS | Status: AC | PRN
  Administered 2021-11-27: 6 mL via INTRAVENOUS

## 2021-11-27 NOTE — ED Notes (Signed)
Pt has stable and balanced gait, ambulates in room to-from bed to toilet. PO  fluid tolerant  ?

## 2021-11-27 NOTE — Sepsis Progress Note (Signed)
Following for sepsis monitoring ?

## 2021-11-27 NOTE — Discharge Instructions (Addendum)
You may alternate Tylenol 1000 mg every 6 hours as needed for pain, fever and Ibuprofen 800 mg every 6-8 hours as needed for pain, fever.  Please take Ibuprofen with food.  Do not take more than 4000 mg of Tylenol (acetaminophen) in a 24 hour period. ? ? ?I recommend close follow-up with your primary care doctor in the next 1 to 2 days.  Your work-up today showed an elevated white blood cell count of 12.8 but otherwise your work-up was reassuring with normal CT scans of your chest, abdomen and pelvis, MRIs of your thoracic and lumbar spine.  Your urine showed a small amount of blood but no other sign of infection.  Your symptoms are likely due to a viral illness.  Your COVID and flu swabs were negative.  You do not need antibiotics today. ?

## 2021-11-28 LAB — URINE CULTURE: Culture: <10000 — AB

## 2021-12-01 LAB — CULTURE, BLOOD (SINGLE)
Culture: NO GROWTH
Special Requests: ADEQUATE

## 2023-02-02 ENCOUNTER — Emergency Department: Payer: BC Managed Care – PPO

## 2023-02-02 ENCOUNTER — Emergency Department
Admission: EM | Admit: 2023-02-02 | Discharge: 2023-02-02 | Disposition: A | Discharge status: Home / Self Care | Payer: BC Managed Care – PPO | Attending: Emergency Medicine | Admitting: Emergency Medicine

## 2023-02-02 ENCOUNTER — Other Ambulatory Visit: Payer: Self-pay

## 2023-02-02 DIAGNOSIS — R55 Syncope and collapse: Secondary | ICD-10-CM | POA: Diagnosis not present

## 2023-02-02 DIAGNOSIS — N12 Tubulo-interstitial nephritis, not specified as acute or chronic: Secondary | ICD-10-CM | POA: Insufficient documentation

## 2023-02-02 LAB — CBC WITH DIFFERENTIAL/PLATELET
Abs Immature Granulocytes: 0.01 10*3/uL (ref 0.00–0.07)
Basophils Absolute: 0 10*3/uL (ref 0.0–0.1)
Basophils Relative: 0 %
Eosinophils Absolute: 0.1 10*3/uL (ref 0.0–0.5)
Eosinophils Relative: 2 %
HCT: 38.6 % (ref 36.0–46.0)
Hemoglobin: 12.3 g/dL (ref 12.0–15.0)
Immature Granulocytes: 0 %
Lymphocytes Relative: 27 %
Lymphs Abs: 2 10*3/uL (ref 0.7–4.0)
MCH: 27.8 pg (ref 26.0–34.0)
MCHC: 31.9 g/dL (ref 30.0–36.0)
MCV: 87.3 fL (ref 80.0–100.0)
Monocytes Absolute: 0.9 10*3/uL (ref 0.1–1.0)
Monocytes Relative: 12 %
Neutro Abs: 4.3 10*3/uL (ref 1.7–7.7)
Neutrophils Relative %: 59 %
Platelets: 370 10*3/uL (ref 150–400)
RBC: 4.42 MIL/uL (ref 3.87–5.11)
RDW: 13.6 % (ref 11.5–15.5)
WBC: 7.3 10*3/uL (ref 4.0–10.5)
nRBC: 0 % (ref 0.0–0.2)

## 2023-02-02 LAB — URINALYSIS, W/ REFLEX TO CULTURE (INFECTION SUSPECTED)
Bilirubin Urine: NEGATIVE
Glucose, UA: NEGATIVE mg/dL
Ketones, ur: NEGATIVE mg/dL
Leukocytes,Ua: NEGATIVE
Nitrite: NEGATIVE
Protein, ur: 30 mg/dL — AB
Specific Gravity, Urine: 1.018 (ref 1.005–1.030)
pH: 5 (ref 5.0–8.0)

## 2023-02-02 LAB — COMPREHENSIVE METABOLIC PANEL
ALT: 12 U/L (ref 0–44)
AST: 14 U/L — ABNORMAL LOW (ref 15–41)
Albumin: 4.1 g/dL (ref 3.5–5.0)
Alkaline Phosphatase: 61 U/L (ref 38–126)
Anion gap: 9 (ref 5–15)
BUN: 7 mg/dL (ref 6–20)
CO2: 21 mmol/L — ABNORMAL LOW (ref 22–32)
Calcium: 8.8 mg/dL — ABNORMAL LOW (ref 8.9–10.3)
Chloride: 105 mmol/L (ref 98–111)
Creatinine, Ser: 0.77 mg/dL (ref 0.44–1.00)
GFR, Estimated: >60 mL/min (ref >60)
Glucose, Bld: 105 mg/dL — ABNORMAL HIGH (ref 70–99)
Potassium: 3.8 mmol/L (ref 3.5–5.1)
Sodium: 135 mmol/L (ref 135–145)
Total Bilirubin: 0.7 mg/dL (ref 0.3–1.2)
Total Protein: 7.9 g/dL (ref 6.5–8.1)

## 2023-02-02 LAB — TROPONIN I (HIGH SENSITIVITY): Troponin I (High Sensitivity): 4 ng/L (ref <18)

## 2023-02-02 LAB — LIPASE, BLOOD: Lipase: 27 U/L (ref 11–51)

## 2023-02-02 LAB — POC URINE PREG, ED: Preg Test, Ur: NEGATIVE

## 2023-02-02 MED ORDER — ONDANSETRON HCL 4 MG/2ML IJ SOLN
4.0000 mg | Freq: Once | INTRAMUSCULAR | Status: AC
  Administered 2023-02-02: 4 mg via INTRAVENOUS
  Filled 2023-02-02: qty 2

## 2023-02-02 MED ORDER — SODIUM CHLORIDE 0.9 % IV SOLN
1.0000 g | Freq: Once | INTRAVENOUS | Status: AC
  Administered 2023-02-02: 1 g via INTRAVENOUS
  Filled 2023-02-02: qty 10

## 2023-02-02 MED ORDER — HYDROCODONE-ACETAMINOPHEN 5-325 MG PO TABS: 1.0000 | ORAL_TABLET | Freq: Four times a day (QID) | ORAL | 0 refills | Status: AC | PRN

## 2023-02-02 MED ORDER — SODIUM CHLORIDE 0.9 % IV BOLUS
1000.0000 mL | Freq: Once | INTRAVENOUS | Status: AC
  Administered 2023-02-02: 1000 mL via INTRAVENOUS

## 2023-02-02 MED ORDER — CIPROFLOXACIN HCL 500 MG PO TABS: 500.0000 mg | ORAL_TABLET | Freq: Two times a day (BID) | ORAL | 0 refills | Status: AC

## 2023-02-02 MED ORDER — IOHEXOL 300 MG/ML  SOLN
100.0000 mL | Freq: Once | INTRAMUSCULAR | Status: AC | PRN
  Administered 2023-02-02: 100 mL via INTRAVENOUS

## 2023-02-02 NOTE — ED Triage Notes (Addendum)
Pt here after a syncopal episode. Pt having abd and chest pain. Pt's family states pt was found on the porch outside passed out. Pt was prescribed zofran yesterday. Pt crying in triage. Pt states she was just tired this morning but does not remember what happened before she passed out. Pt has also been taking Azo for a UTI.

## 2023-02-02 NOTE — Discharge Instructions (Addendum)
You were seen in the emergency room today for evaluation after an episode of passing out.  I suspect that this was related to being dehydrated and in the sun.  Please make sure you are staying hydrated and avoiding prolonged periods in the heat.  I am concerned that you have an infection of your kidneys.  I have sent a prescription for an antibiotic to your pharmacy.  Please take this as directed.  Please also arrange follow-up with urology.  Return to the ER for new or worsening symptoms.

## 2023-02-02 NOTE — ED Provider Notes (Signed)
[facility] Provider Note    Event Date/Time   First MD Initiated Contact with Patient 02/02/23 1229     (approximate)   History   Loss of Consciousness   HPI  Christina Roth is a 32 y.o. female presenting to the emergency department for evaluation following a syncopal episode.  Patient reports she has been having dysuria for about a week.  She went to an urgent care a few days ago where her urinalysis did not show an infection, but they said they would send a culture and discharged her on Zofran.  She has had poor p.o. intake in the setting of this.  She does also report some lower abdominal pain.  Today, she had been in and out of her house several times when she had a syncopal episode.  Does not recall if she had any prodromal symptoms.  For the last several weeks, she has had some intermittent chest pain described as a pressure in the center of her chest.  No shortness of breath.      Physical Exam   Triage Vital Signs: ED Triage Vitals [02/02/23 1146]  Encounter Vitals Group     BP (!) 122/101     Systolic BP Percentile      Diastolic BP Percentile      Pulse Rate 87     Resp 16     Temp 98.5 F (36.9 C)     Temp Source Oral     SpO2 99 %     Weight 125 lb (56.7 kg)     Height 5\' 7"  (1.702 m)     Head Circumference      Peak Flow      Pain Score 5     Pain Loc      Pain Education      Exclude from Growth Chart     Most recent vital signs: Vitals:   02/02/23 1146 02/02/23 1537  BP: (!) 122/101 98/70  Pulse: 87 72  Resp: 16 14  Temp: 98.5 F (36.9 C) 97.6 F (36.4 C)  SpO2: 99% 100%     General: Awake, interactive  CV:  Regular rate, good peripheral perfusion.  Resp:  Lungs clear, unlabored respirations.  Abd:  Soft, nondistended, tenderness throughout the periumbilical and lower abdominal region without rebound or guarding Neuro:  Symmetric facial movement, fluid speech, 5-5 strength in the bilateral upper and lower  extremities with normal sensation and coordination   ED Results / Procedures / Treatments   Labs (all labs ordered are listed, but only abnormal results are displayed) Labs Reviewed  COMPREHENSIVE METABOLIC PANEL - Abnormal; Notable for the following components:      Result Value   CO2 21 (*)    Glucose, Bld 105 (*)    Calcium 8.8 (*)    AST 14 (*)    All other components within normal limits  URINALYSIS, W/ REFLEX TO CULTURE (INFECTION SUSPECTED) - Abnormal; Notable for the following components:   Color, Urine YELLOW (*)    APPearance HAZY (*)    Hgb urine dipstick MODERATE (*)    Protein, ur 30 (*)    Bacteria, UA RARE (*)    All other components within normal limits  URINE CULTURE  CBC WITH DIFFERENTIAL/PLATELET  LIPASE, BLOOD  POC URINE PREG, ED  TROPONIN I (HIGH SENSITIVITY)     EKG EKG independently reviewed interpreted by myself (ER attending) demonstrates:  EKG demonstrates normal sinus rhythm at a rate  of 72, PR 148, QRS 74, QTc 422, no acute ST changes  RADIOLOGY Imaging independently reviewed and interpreted by myself demonstrates:  CXR without focal consolidation CT abdomen pelvis without appendicitis.  Does demonstrate cortical enhancement in the kidney concerning for pyelonephritis.  Radiology also notes some wall thickening of the left colon.  PROCEDURES:  Critical Care performed: No  Procedures   MEDICATIONS ORDERED IN ED: Medications  ondansetron (ZOFRAN) injection 4 mg (4 mg Intravenous Given 02/02/23 1310)  sodium chloride 0.9 % bolus 1,000 mL (0 mLs Intravenous Stopped 02/02/23 1402)  iohexol (OMNIPAQUE) 300 MG/ML solution 100 mL (100 mLs Intravenous Contrast Given 02/02/23 1346)  cefTRIAXone (ROCEPHIN) 1 g in sodium chloride 0.9 % 100 mL IVPB (1 g Intravenous New Bag/Given 02/02/23 1538)     IMPRESSION / MDM / ASSESSMENT AND PLAN / ED COURSE  I reviewed the triage vital signs and the nursing notes.  Differential diagnosis includes, but is not  limited to, vasovagal syncope in the setting of decreased p.o. intake and being in the heat, arrhythmia, anemia, electrolyte abnormality, low risk PE and PERC negative, consideration for appendicitis, UTI, pyelonephritis, other acute intra-abdominal process  Patient's presentation is most consistent with acute presentation with potential threat to life or bodily function.  32 year old female presenting to the emergency department for evaluation following a syncopal episode in the setting of decreased p.o. intake, abdominal pain, ongoing chest pain.  Will obtain labs, EKG, CT abdomen pelvis, and chest xray to further evaluate.   Labs without severe derangement.  Urinalysis slightly concerning for infection with 6-10 white blood cells but rare bacteria in the setting of microscopic hematuria.  However, with patient's clinical history of dysuria with lower abdominal pain and occasional flank pain and her CT findings concerning for pyelonephritis do think it is appropriate to go ahead and treat her.  Urine culture sent.  She was ordered for dose of Rocephin here.  She is tolerating p.o.  Otherwise without evidence of sepsis, do think she is appropriate for outpatient management.  Patient is comfortable with this plan.  Will DC with course of ciprofloxacin and short course of pain medicine.  Strict return precautions provided.  Patient discharged in stable condition.        FINAL CLINICAL IMPRESSION(S) / ED DIAGNOSES   Final diagnoses:  Syncope and collapse  Pyelonephritis     Rx / DC Orders   ED Discharge Orders          Ordered    ciprofloxacin (CIPRO) 500 MG tablet  2 times daily        02/02/23 1644    HYDROcodone-acetaminophen (NORCO) 5-325 MG tablet  Every 6 hours PRN        02/02/23 1644             Note:  This document was prepared using Dragon voice recognition software and may include unintentional dictation errors.   Trinna Post, MD 02/02/23 [id number]

## 2023-02-04 LAB — URINE CULTURE: Culture: <10000 — AB
